# Patient Record
Sex: Male | Born: 1965 | Race: Black or African American | Hispanic: No | Marital: Single | Smoking: Current every day smoker
Health system: Southern US, Community
[De-identification: ages and names within clinical notes are randomized; demographics above are authoritative.]

## PROBLEM LIST (undated history)

## (undated) DIAGNOSIS — W3400XA Accidental discharge from unspecified firearms or gun, initial encounter: Secondary | ICD-10-CM

## (undated) DIAGNOSIS — E119 Type 2 diabetes mellitus without complications: Secondary | ICD-10-CM

## (undated) DIAGNOSIS — T148XXA Other injury of unspecified body region, initial encounter: Secondary | ICD-10-CM

## (undated) HISTORY — PX: ABDOMINAL SURGERY: SHX537

## (undated) HISTORY — PX: SPLENECTOMY: SUR1306

## (undated) HISTORY — PX: COLOSTOMY: SHX63

## (undated) HISTORY — PX: COLOSTOMY REVISION: SHX5232

---

## 2011-10-29 ENCOUNTER — Encounter (HOSPITAL_COMMUNITY): Payer: Self-pay | Admitting: Emergency Medicine

## 2011-10-29 ENCOUNTER — Emergency Department (INDEPENDENT_AMBULATORY_CARE_PROVIDER_SITE_OTHER)
Admission: EM | Admit: 2011-10-29 | Discharge: 2011-10-29 | Disposition: A | Discharge status: Home / Self Care | Payer: Medicaid - Out of State | Source: Home / Self Care | Attending: Family Medicine | Admitting: Family Medicine

## 2011-10-29 DIAGNOSIS — N5082 Scrotal pain: Secondary | ICD-10-CM

## 2011-10-29 DIAGNOSIS — N509 Disorder of male genital organs, unspecified: Secondary | ICD-10-CM

## 2011-10-29 HISTORY — DX: Accidental discharge from unspecified firearms or gun, initial encounter: W34.00XA

## 2011-10-29 HISTORY — DX: Other injury of unspecified body region, initial encounter: T14.8XXA

## 2011-10-29 LAB — POCT URINALYSIS DIP (DEVICE)
Leukocytes, UA: NEGATIVE
Nitrite: NEGATIVE
Protein, ur: 30 mg/dL — AB
Urobilinogen, UA: 0.2 mg/dL (ref 0.0–1.0)

## 2011-10-29 MED ORDER — AZITHROMYCIN 500 MG PO TABS: 1000.0000 mg | ORAL_TABLET | Freq: Once | ORAL | Status: AC

## 2011-10-29 MED ORDER — IBUPROFEN 600 MG PO TABS: 600.0000 mg | ORAL_TABLET | Freq: Three times a day (TID) | ORAL | Status: AC

## 2011-10-29 NOTE — Discharge Instructions (Signed)
Your urine test does not show signs of infection. I decided to treat you empirically (without test results) for possible Chlamydia infection given asymptomatic discharge from your penis. We will contact you about your test results and will call in a medication or ask you to return for an injection if needed. I am not sure what is causing your discomfort. As there are not obvious hernias, focal tenderness or testicular mass on my exam. Also your prostate felt normal on examination. It could be just a pulled muscle or ligament. Take the prescribed medications as instructed. Followup with the urologist number provided above if persistent or worsening symptoms.
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
## 2011-10-29 NOTE — ED Notes (Signed)
Pulling pain from low abdomen to scrotum per patient.  Onset approx 2 weeks ago.  Denies pain with urination.  Several days over the past 2 weeks has had tingling sensation, feels like he needs to urinate, but only a small amount produced.  Denies penile discharge

## 2011-10-31 LAB — GC/CHLAMYDIA PROBE AMP, GENITAL
Chlamydia, DNA Probe: NEGATIVE
GC Probe Amp, Genital: NEGATIVE

## 2018-10-28 ENCOUNTER — Emergency Department (HOSPITAL_COMMUNITY)
Admission: EM | Admit: 2018-10-28 | Discharge: 2018-10-28 | Disposition: A | Discharge status: Home / Self Care | Payer: Self-pay | Attending: Emergency Medicine | Admitting: Emergency Medicine

## 2018-10-28 ENCOUNTER — Other Ambulatory Visit: Payer: Self-pay

## 2018-10-28 ENCOUNTER — Emergency Department (HOSPITAL_COMMUNITY): Payer: Self-pay

## 2018-10-28 ENCOUNTER — Encounter (HOSPITAL_COMMUNITY): Payer: Self-pay | Admitting: Emergency Medicine

## 2018-10-28 DIAGNOSIS — S60221A Contusion of right hand, initial encounter: Secondary | ICD-10-CM | POA: Insufficient documentation

## 2018-10-28 DIAGNOSIS — R1031 Right lower quadrant pain: Secondary | ICD-10-CM | POA: Insufficient documentation

## 2018-10-28 DIAGNOSIS — Y9289 Other specified places as the place of occurrence of the external cause: Secondary | ICD-10-CM | POA: Insufficient documentation

## 2018-10-28 DIAGNOSIS — F17200 Nicotine dependence, unspecified, uncomplicated: Secondary | ICD-10-CM | POA: Insufficient documentation

## 2018-10-28 DIAGNOSIS — W010XXA Fall on same level from slipping, tripping and stumbling without subsequent striking against object, initial encounter: Secondary | ICD-10-CM | POA: Insufficient documentation

## 2018-10-28 DIAGNOSIS — Y9389 Activity, other specified: Secondary | ICD-10-CM | POA: Insufficient documentation

## 2018-10-28 DIAGNOSIS — Y990 Civilian activity done for income or pay: Secondary | ICD-10-CM | POA: Insufficient documentation

## 2018-10-28 MED ORDER — MELOXICAM 7.5 MG PO TABS: 15.0000 mg | ORAL_TABLET | Freq: Every day | ORAL | 0 refills | Status: DC

## 2018-10-28 NOTE — Discharge Instructions (Addendum)
X-ray today was negative. You can stop the naprosyn and try taking the meloxicam for added relief. Can also use ice when needed. Follow-up with your primary care doctor. Return here for any new/acute changes.

## 2018-10-28 NOTE — ED Notes (Signed)
Bed: WA03 Expected date:  Expected time:  Means of arrival:  Comments: 

## 2018-10-28 NOTE — ED Triage Notes (Signed)
Patient slipped on some oil at work. Patient tried to break his fall with his right hand. Right hand has a little swelling on his hand. Patient states he did a split and has some pain in right groin.
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
## 2018-10-28 NOTE — ED Notes (Signed)
Bed: WTR8 Expected date:  Expected time:  Means of arrival:  Comments: 

## 2019-03-05 ENCOUNTER — Ambulatory Visit (HOSPITAL_BASED_OUTPATIENT_CLINIC_OR_DEPARTMENT_OTHER)
Admission: RE | Admit: 2019-03-05 | Discharge: 2019-03-05 | Disposition: A | Discharge status: Home / Self Care | Payer: Self-pay | Source: Ambulatory Visit | Attending: Physician Assistant | Admitting: Physician Assistant

## 2019-03-05 ENCOUNTER — Emergency Department (HOSPITAL_COMMUNITY)
Admission: EM | Admit: 2019-03-05 | Discharge: 2019-03-05 | Disposition: A | Discharge status: Home / Self Care | Payer: Medicaid Other | Attending: Emergency Medicine | Admitting: Emergency Medicine

## 2019-03-05 ENCOUNTER — Emergency Department (HOSPITAL_COMMUNITY): Payer: Medicaid Other

## 2019-03-05 ENCOUNTER — Encounter (HOSPITAL_COMMUNITY): Payer: Self-pay | Admitting: *Deleted

## 2019-03-05 ENCOUNTER — Other Ambulatory Visit: Payer: Self-pay

## 2019-03-05 DIAGNOSIS — R2241 Localized swelling, mass and lump, right lower limb: Secondary | ICD-10-CM | POA: Insufficient documentation

## 2019-03-05 DIAGNOSIS — M7989 Other specified soft tissue disorders: Secondary | ICD-10-CM

## 2019-03-05 DIAGNOSIS — F17210 Nicotine dependence, cigarettes, uncomplicated: Secondary | ICD-10-CM | POA: Insufficient documentation

## 2019-03-05 LAB — BASIC METABOLIC PANEL
Anion gap: 9 (ref 5–15)
BUN: 13 mg/dL (ref 6–20)
CO2: 24 mmol/L (ref 22–32)
Calcium: 8.9 mg/dL (ref 8.9–10.3)
Chloride: 104 mmol/L (ref 98–111)
Creatinine, Ser: 0.98 mg/dL (ref 0.61–1.24)
GFR calc Af Amer: >60 mL/min (ref >60)
GFR calc non Af Amer: >60 mL/min (ref >60)
Glucose, Bld: 130 mg/dL — ABNORMAL HIGH (ref 70–99)
Potassium: 3.6 mmol/L (ref 3.5–5.1)
Sodium: 137 mmol/L (ref 135–145)

## 2019-03-05 LAB — CBC WITH DIFFERENTIAL/PLATELET
Abs Immature Granulocytes: 0.01 10*3/uL (ref 0.00–0.07)
Basophils Absolute: 0.1 10*3/uL (ref 0.0–0.1)
Basophils Relative: 1 %
Eosinophils Absolute: 0.3 10*3/uL (ref 0.0–0.5)
Eosinophils Relative: 3 %
HCT: 40.6 % (ref 39.0–52.0)
Hemoglobin: 13.3 g/dL (ref 13.0–17.0)
Immature Granulocytes: 0 %
Lymphocytes Relative: 26 %
Lymphs Abs: 2.1 10*3/uL (ref 0.7–4.0)
MCH: 30.3 pg (ref 26.0–34.0)
MCHC: 32.8 g/dL (ref 30.0–36.0)
MCV: 92.5 fL (ref 80.0–100.0)
Monocytes Absolute: 1 10*3/uL (ref 0.1–1.0)
Monocytes Relative: 13 %
Neutro Abs: 4.8 10*3/uL (ref 1.7–7.7)
Neutrophils Relative %: 57 %
Platelets: 416 10*3/uL — ABNORMAL HIGH (ref 150–400)
RBC: 4.39 MIL/uL (ref 4.22–5.81)
RDW: 14 % (ref 11.5–15.5)
WBC: 8.3 10*3/uL (ref 4.0–10.5)
nRBC: 0 % (ref 0.0–0.2)

## 2019-03-05 MED ORDER — ENOXAPARIN SODIUM 150 MG/ML ~~LOC~~ SOLN
1.0000 mg/kg | Freq: Once | SUBCUTANEOUS | Status: AC
  Administered 2019-03-05: 125 mg via SUBCUTANEOUS
  Filled 2019-03-05: qty 0.83

## 2019-03-05 NOTE — Progress Notes (Signed)
Right leg venous duplex exam was completed.  Preliminary results can be found under CV Proc in Chart Review.   Cira Servant, RVT, RDCS
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
## 2019-03-05 NOTE — ED Notes (Signed)
Pt verbalized understanding od d/c instructions and followup care. Pt had no further questions at this time

## 2019-03-05 NOTE — ED Triage Notes (Signed)
Pt c/o R knee and L index finger x 1 week. Denies injury, reports waking up with pain.

## 2019-03-05 NOTE — Discharge Instructions (Addendum)
Your laboratory results are within normal limits.  The x-ray of your right knee showed a small effusion.  I am concerned about a clot to your right leg, and ultrasound has been ordered, this will be performed tomorrow morning, please see the order attached to your discharge papers.  You received Lovenox while in the emergency department today, please refrain from taking any NSAIDs.

## 2019-03-16 ENCOUNTER — Other Ambulatory Visit: Payer: Self-pay

## 2019-03-16 ENCOUNTER — Emergency Department (HOSPITAL_COMMUNITY)
Admission: EM | Admit: 2019-03-16 | Discharge: 2019-03-16 | Disposition: A | Discharge status: Home / Self Care | Payer: Medicaid Other | Attending: Emergency Medicine | Admitting: Emergency Medicine

## 2019-03-16 ENCOUNTER — Emergency Department (HOSPITAL_COMMUNITY): Payer: Medicaid Other

## 2019-03-16 DIAGNOSIS — S82121A Displaced fracture of lateral condyle of right tibia, initial encounter for closed fracture: Secondary | ICD-10-CM

## 2019-03-16 DIAGNOSIS — X509XXA Other and unspecified overexertion or strenuous movements or postures, initial encounter: Secondary | ICD-10-CM | POA: Insufficient documentation

## 2019-03-16 DIAGNOSIS — Y92812 Truck as the place of occurrence of the external cause: Secondary | ICD-10-CM | POA: Insufficient documentation

## 2019-03-16 DIAGNOSIS — R61 Generalized hyperhidrosis: Secondary | ICD-10-CM | POA: Insufficient documentation

## 2019-03-16 DIAGNOSIS — F17200 Nicotine dependence, unspecified, uncomplicated: Secondary | ICD-10-CM | POA: Insufficient documentation

## 2019-03-16 DIAGNOSIS — Z79899 Other long term (current) drug therapy: Secondary | ICD-10-CM | POA: Insufficient documentation

## 2019-03-16 DIAGNOSIS — R509 Fever, unspecified: Secondary | ICD-10-CM | POA: Insufficient documentation

## 2019-03-16 DIAGNOSIS — Y9389 Activity, other specified: Secondary | ICD-10-CM | POA: Insufficient documentation

## 2019-03-16 DIAGNOSIS — Y999 Unspecified external cause status: Secondary | ICD-10-CM | POA: Insufficient documentation

## 2019-03-16 LAB — BASIC METABOLIC PANEL
Anion gap: 12 (ref 5–15)
BUN: 8 mg/dL (ref 6–20)
CO2: 27 mmol/L (ref 22–32)
Calcium: 9.2 mg/dL (ref 8.9–10.3)
Chloride: 95 mmol/L — ABNORMAL LOW (ref 98–111)
Creatinine, Ser: 1.05 mg/dL (ref 0.61–1.24)
GFR calc Af Amer: >60 mL/min (ref >60)
GFR calc non Af Amer: >60 mL/min (ref >60)
Glucose, Bld: 154 mg/dL — ABNORMAL HIGH (ref 70–99)
Potassium: 4.1 mmol/L (ref 3.5–5.1)
Sodium: 134 mmol/L — ABNORMAL LOW (ref 135–145)

## 2019-03-16 LAB — CBC
HCT: 41.4 % (ref 39.0–52.0)
Hemoglobin: 13.7 g/dL (ref 13.0–17.0)
MCH: 29.9 pg (ref 26.0–34.0)
MCHC: 33.1 g/dL (ref 30.0–36.0)
MCV: 90.4 fL (ref 80.0–100.0)
Platelets: 614 10*3/uL — ABNORMAL HIGH (ref 150–400)
RBC: 4.58 MIL/uL (ref 4.22–5.81)
RDW: 13.2 % (ref 11.5–15.5)
WBC: 10.3 10*3/uL (ref 4.0–10.5)
nRBC: 0 % (ref 0.0–0.2)

## 2019-03-16 MED ORDER — OXYCODONE-ACETAMINOPHEN 5-325 MG PO TABS
1.0000 | ORAL_TABLET | Freq: Once | ORAL | Status: AC
  Administered 2019-03-16: 1 via ORAL
  Filled 2019-03-16: qty 1

## 2019-03-16 MED ORDER — OXYCODONE-ACETAMINOPHEN 5-325 MG PO TABS: 2.0000 | ORAL_TABLET | ORAL | 0 refills | Status: DC | PRN
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
## 2019-03-16 NOTE — ED Notes (Signed)
Patient transported to CT 

## 2019-03-16 NOTE — ED Notes (Signed)
Pt verbalized understanding of d/c instructions, prescriptions and follow up care.NO additional questions at his time.

## 2019-03-16 NOTE — Discharge Instructions (Addendum)
Apply ice as needed.  Keep the knee elevated as much as possible.  No weight bearing on your right leg until cleared by the orthopedic doctor.  Wear the knee brace as needed.

## 2019-03-16 NOTE — ED Notes (Signed)
Ortho tech contacted regarding knee immobilizer

## 2019-03-16 NOTE — ED Triage Notes (Signed)
Pt c/o right knee/leg pain and swelling. Seen here 03/05/2019 for same. States his leg is no better.

## 2020-02-21 ENCOUNTER — Other Ambulatory Visit: Payer: Self-pay

## 2020-02-21 ENCOUNTER — Observation Stay (HOSPITAL_COMMUNITY)
Admission: EM | Admit: 2020-02-21 | Discharge: 2020-02-22 | Disposition: A | Discharge status: Home / Self Care | Payer: Medicaid Other | Attending: Internal Medicine | Admitting: Internal Medicine

## 2020-02-21 ENCOUNTER — Emergency Department (HOSPITAL_COMMUNITY): Payer: Medicaid Other

## 2020-02-21 DIAGNOSIS — Z20822 Contact with and (suspected) exposure to covid-19: Secondary | ICD-10-CM | POA: Insufficient documentation

## 2020-02-21 DIAGNOSIS — I10 Essential (primary) hypertension: Secondary | ICD-10-CM | POA: Insufficient documentation

## 2020-02-21 DIAGNOSIS — E119 Type 2 diabetes mellitus without complications: Secondary | ICD-10-CM | POA: Insufficient documentation

## 2020-02-21 DIAGNOSIS — F17200 Nicotine dependence, unspecified, uncomplicated: Secondary | ICD-10-CM | POA: Insufficient documentation

## 2020-02-21 DIAGNOSIS — R739 Hyperglycemia, unspecified: Secondary | ICD-10-CM

## 2020-02-21 DIAGNOSIS — J36 Peritonsillar abscess: Principal | ICD-10-CM | POA: Diagnosis present

## 2020-02-21 HISTORY — DX: Type 2 diabetes mellitus without complications: E11.9

## 2020-02-21 LAB — CBC WITH DIFFERENTIAL/PLATELET
Abs Immature Granulocytes: 0.1 10*3/uL — ABNORMAL HIGH (ref 0.00–0.07)
Basophils Absolute: 0.1 10*3/uL (ref 0.0–0.1)
Basophils Relative: 0 %
Eosinophils Absolute: 0.1 10*3/uL (ref 0.0–0.5)
Eosinophils Relative: 0 %
HCT: 43.2 % (ref 39.0–52.0)
Hemoglobin: 14.5 g/dL (ref 13.0–17.0)
Immature Granulocytes: 1 %
Lymphocytes Relative: 14 %
Lymphs Abs: 2.7 10*3/uL (ref 0.7–4.0)
MCH: 30.1 pg (ref 26.0–34.0)
MCHC: 33.6 g/dL (ref 30.0–36.0)
MCV: 89.6 fL (ref 80.0–100.0)
Monocytes Absolute: 2.2 10*3/uL — ABNORMAL HIGH (ref 0.1–1.0)
Monocytes Relative: 11 %
Neutro Abs: 14.8 10*3/uL — ABNORMAL HIGH (ref 1.7–7.7)
Neutrophils Relative %: 74 %
Platelets: 339 10*3/uL (ref 150–400)
RBC: 4.82 MIL/uL (ref 4.22–5.81)
RDW: 14 % (ref 11.5–15.5)
WBC: 19.9 10*3/uL — ABNORMAL HIGH (ref 4.0–10.5)
nRBC: 0 % (ref 0.0–0.2)

## 2020-02-21 LAB — HIV ANTIBODY (ROUTINE TESTING W REFLEX): HIV Screen 4th Generation wRfx: NONREACTIVE

## 2020-02-21 LAB — RESPIRATORY PANEL BY RT PCR (FLU A&B, COVID)
Influenza A by PCR: NEGATIVE
Influenza B by PCR: NEGATIVE
SARS Coronavirus 2 by RT PCR: NEGATIVE

## 2020-02-21 LAB — BASIC METABOLIC PANEL
Anion gap: 11 (ref 5–15)
BUN: 15 mg/dL (ref 6–20)
CO2: 23 mmol/L (ref 22–32)
Calcium: 9 mg/dL (ref 8.9–10.3)
Chloride: 101 mmol/L (ref 98–111)
Creatinine, Ser: 1.01 mg/dL (ref 0.61–1.24)
GFR, Estimated: >60 mL/min (ref >60)
Glucose, Bld: 283 mg/dL — ABNORMAL HIGH (ref 70–99)
Potassium: 3.7 mmol/L (ref 3.5–5.1)
Sodium: 135 mmol/L (ref 135–145)

## 2020-02-21 LAB — I-STAT CHEM 8, ED
BUN: 16 mg/dL (ref 6–20)
Calcium, Ion: 1.15 mmol/L (ref 1.15–1.40)
Chloride: 100 mmol/L (ref 98–111)
Creatinine, Ser: 0.8 mg/dL (ref 0.61–1.24)
Glucose, Bld: 297 mg/dL — ABNORMAL HIGH (ref 70–99)
HCT: 45 % (ref 39.0–52.0)
Hemoglobin: 15.3 g/dL (ref 13.0–17.0)
Potassium: 3.7 mmol/L (ref 3.5–5.1)
Sodium: 138 mmol/L (ref 135–145)
TCO2: 27 mmol/L (ref 22–32)

## 2020-02-21 LAB — CBG MONITORING, ED: Glucose-Capillary: 375 mg/dL — ABNORMAL HIGH (ref 70–99)

## 2020-02-21 LAB — GLUCOSE, CAPILLARY
Glucose-Capillary: 385 mg/dL — ABNORMAL HIGH (ref 70–99)
Glucose-Capillary: 216 mg/dL — ABNORMAL HIGH (ref 70–99)

## 2020-02-21 LAB — GROUP A STREP BY PCR: Group A Strep by PCR: NOT DETECTED

## 2020-02-21 MED ORDER — ACETAMINOPHEN 650 MG RE SUPP: 650.0000 mg | Freq: Four times a day (QID) | RECTAL | Status: DC | PRN

## 2020-02-21 MED ORDER — ACETAMINOPHEN 325 MG PO TABS: 650.0000 mg | ORAL_TABLET | Freq: Four times a day (QID) | ORAL | Status: DC | PRN

## 2020-02-21 MED ORDER — CLINDAMYCIN PHOSPHATE 900 MG/50ML IV SOLN
900.0000 mg | Freq: Once | INTRAVENOUS | Status: AC
  Administered 2020-02-21: 900 mg via INTRAVENOUS
  Filled 2020-02-21: qty 50

## 2020-02-21 MED ORDER — LIVING WELL WITH DIABETES BOOK
Freq: Once | Status: AC
  Filled 2020-02-21: qty 1

## 2020-02-21 MED ORDER — LIDOCAINE-EPINEPHRINE 1 %-1:100000 IJ SOLN
10.0000 mL | Freq: Once | INTRAMUSCULAR | Status: AC
  Administered 2020-02-21: 10 mL
  Filled 2020-02-21: qty 1

## 2020-02-21 MED ORDER — INSULIN ASPART 100 UNIT/ML ~~LOC~~ SOLN
0.0000 [IU] | Freq: Three times a day (TID) | SUBCUTANEOUS | Status: DC
  Administered 2020-02-21 (×2): 5 [IU] via SUBCUTANEOUS
  Administered 2020-02-22: 2 [IU] via SUBCUTANEOUS

## 2020-02-21 MED ORDER — LACTATED RINGERS IV BOLUS
1000.0000 mL | Freq: Once | INTRAVENOUS | Status: AC
  Administered 2020-02-21: 1000 mL via INTRAVENOUS

## 2020-02-21 MED ORDER — SODIUM CHLORIDE 0.9 % IV SOLN: Freq: Once | INTRAVENOUS | Status: AC

## 2020-02-21 MED ORDER — DEXAMETHASONE SODIUM PHOSPHATE 10 MG/ML IJ SOLN
10.0000 mg | Freq: Once | INTRAMUSCULAR | Status: AC
  Administered 2020-02-21: 10 mg via INTRAVENOUS
  Filled 2020-02-21: qty 1

## 2020-02-21 MED ORDER — IOHEXOL 300 MG/ML  SOLN
75.0000 mL | Freq: Once | INTRAMUSCULAR | Status: AC | PRN
  Administered 2020-02-21: 75 mL via INTRAVENOUS

## 2020-02-21 MED ORDER — ONDANSETRON HCL 4 MG/2ML IJ SOLN: 4.0000 mg | Freq: Four times a day (QID) | INTRAMUSCULAR | Status: DC | PRN

## 2020-02-21 MED ORDER — MORPHINE SULFATE (PF) 2 MG/ML IV SOLN: 2.0000 mg | INTRAVENOUS | Status: DC | PRN

## 2020-02-21 MED ORDER — HYDRALAZINE HCL 20 MG/ML IJ SOLN: 10.0000 mg | Freq: Three times a day (TID) | INTRAMUSCULAR | Status: DC | PRN

## 2020-02-21 MED ORDER — ONDANSETRON HCL 4 MG PO TABS: 4.0000 mg | ORAL_TABLET | Freq: Four times a day (QID) | ORAL | Status: DC | PRN

## 2020-02-21 MED ORDER — CLINDAMYCIN PHOSPHATE 900 MG/50ML IV SOLN
900.0000 mg | Freq: Three times a day (TID) | INTRAVENOUS | Status: DC
  Administered 2020-02-21 – 2020-02-22 (×2): 900 mg via INTRAVENOUS
  Filled 2020-02-21 (×4): qty 50

## 2020-02-21 MED ORDER — SODIUM CHLORIDE 0.9 % IV SOLN
3.0000 g | Freq: Once | INTRAVENOUS | Status: AC
  Administered 2020-02-21: 3 g via INTRAVENOUS
  Filled 2020-02-21: qty 3

## 2020-02-21 NOTE — Progress Notes (Addendum)
Inpatient Diabetes Program Recommendations  AACE/ADA: New Consensus Statement on Inpatient Glycemic Control (2015)  Target Ranges:  Prepandial:   less than 140 mg/dL      Peak postprandial:   less than 180 mg/dL (1-2 hours)      Critically ill patients:  140 - 180 mg/dL   Lab Results  Component Value Date   GLUCAP 375 (H) 02/21/2020    Review of Glycemic Control  Diabetes history: boarderline DM hx Outpatient Diabetes medications: none Current orders for Inpatient glycemic control: Novolog 0-6 tid  Inpatient Diabetes Program Recommendations:    Note pt received Decadron 10 mg prior to abscess drainage.  -  Increase Novolog to "moderate" 0-15 units tid + hs   Pt with no insurance will need PCP follow up. Suggest starting at least a sulfonylurea at time of d/c. And close follow up. Living well with DM booklet ordered.  Addendum 2:40 pm:  Spoke with pt at bedside for hyperglycemia. Pt's wife has DM. 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 Pt does not have a PCP yet but does have an appointment with the [facility] on November 15th. Encouraged pt to keep appointment. Pt reports having medicaid insurance.  Discussed in detail diet and exercise. Discussed hypoglycemia and A1c and glucose goals. Pt has a plan with wife for diet modifications and had many questions related to specific food choices.   Thanks,  Tama Headings RN, MSN, BC-ADM Inpatient Diabetes Coordinator Team Pager [phone] (8a-5p)

## 2020-02-21 NOTE — ED Notes (Signed)
Lunch Tray Ordered @ 1020.  

## 2020-02-21 NOTE — ED Notes (Signed)
Pt noted to be 88% on room air while asleep, pt reports having sleep apnea. Placed on O2 at 2lpm via [place].

## 2020-02-21 NOTE — ED Notes (Signed)
Patient transported to CT 
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
## 2020-02-21 NOTE — ED Triage Notes (Signed)
Pt has been fight sinus drainage x 3 days and then today noticed severe swelling in his throat and unaable to swallow secretions and barely able to talk. Major swelling to left side. Unable to visualize airway.

## 2020-02-21 NOTE — ED Notes (Signed)
Pt to room at this time, Dr. Betsey Holiday at bedside.
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
## 2020-02-21 NOTE — ED Notes (Addendum)
Pt reports feeling much better after I &D procedure. Pt states "I can breathe better". Will continue to monitor.

## 2020-02-21 NOTE — ED Notes (Signed)
Pharmacy contacted regarding Unasyn; medication being sent ASAP

## 2020-02-21 NOTE — ED Notes (Signed)
Suction set up prepared at bedside.

## 2020-02-22 ENCOUNTER — Encounter (HOSPITAL_COMMUNITY): Payer: Self-pay | Admitting: Internal Medicine

## 2020-02-22 DIAGNOSIS — J36 Peritonsillar abscess: Secondary | ICD-10-CM

## 2020-02-22 DIAGNOSIS — E1169 Type 2 diabetes mellitus with other specified complication: Secondary | ICD-10-CM

## 2020-02-22 LAB — HEMOGLOBIN A1C
Hgb A1c MFr Bld: 8.8 % — ABNORMAL HIGH (ref 4.8–5.6)
Mean Plasma Glucose: 206 mg/dL

## 2020-02-22 LAB — COMPREHENSIVE METABOLIC PANEL
ALT: 21 U/L (ref 0–44)
AST: 16 U/L (ref 15–41)
Albumin: 3.1 g/dL — ABNORMAL LOW (ref 3.5–5.0)
Alkaline Phosphatase: 81 U/L (ref 38–126)
Anion gap: 9 (ref 5–15)
BUN: 12 mg/dL (ref 6–20)
CO2: 27 mmol/L (ref 22–32)
Calcium: 9.2 mg/dL (ref 8.9–10.3)
Chloride: 99 mmol/L (ref 98–111)
Creatinine, Ser: 0.89 mg/dL (ref 0.61–1.24)
GFR, Estimated: >60 mL/min (ref >60)
Glucose, Bld: 227 mg/dL — ABNORMAL HIGH (ref 70–99)
Potassium: 3.9 mmol/L (ref 3.5–5.1)
Sodium: 135 mmol/L (ref 135–145)
Total Bilirubin: 0.8 mg/dL (ref 0.3–1.2)
Total Protein: 7.4 g/dL (ref 6.5–8.1)

## 2020-02-22 LAB — GLUCOSE, CAPILLARY
Glucose-Capillary: 226 mg/dL — ABNORMAL HIGH (ref 70–99)
Glucose-Capillary: 214 mg/dL — ABNORMAL HIGH (ref 70–99)
Glucose-Capillary: 185 mg/dL — ABNORMAL HIGH (ref 70–99)

## 2020-02-22 LAB — CBC
HCT: 40.5 % (ref 39.0–52.0)
Hemoglobin: 14 g/dL (ref 13.0–17.0)
MCH: 30.6 pg (ref 26.0–34.0)
MCHC: 34.6 g/dL (ref 30.0–36.0)
MCV: 88.4 fL (ref 80.0–100.0)
Platelets: 350 10*3/uL (ref 150–400)
RBC: 4.58 MIL/uL (ref 4.22–5.81)
RDW: 13.7 % (ref 11.5–15.5)
WBC: 18.4 10*3/uL — ABNORMAL HIGH (ref 4.0–10.5)
nRBC: 0 % (ref 0.0–0.2)

## 2020-02-22 MED ORDER — RISAQUAD PO CAPS
2.0000 | ORAL_CAPSULE | Freq: Every day | ORAL | 0 refills | Status: DC
Start: 2020-02-23 — End: 2021-09-09

## 2020-02-22 MED ORDER — INSULIN ASPART 100 UNIT/ML ~~LOC~~ SOLN: 0.0000 [IU] | Freq: Every day | SUBCUTANEOUS | Status: DC

## 2020-02-22 MED ORDER — INSULIN ASPART 100 UNIT/ML ~~LOC~~ SOLN
0.0000 [IU] | Freq: Three times a day (TID) | SUBCUTANEOUS | Status: DC
  Administered 2020-02-22: 3 [IU] via SUBCUTANEOUS

## 2020-02-22 MED ORDER — BLOOD GLUCOSE MONITOR KIT: PACK | 0 refills | Status: AC

## 2020-02-22 MED ORDER — GLIPIZIDE 5 MG PO TABS
5.0000 mg | ORAL_TABLET | Freq: Two times a day (BID) | ORAL | Status: DC
  Administered 2020-02-22: 5 mg via ORAL
  Filled 2020-02-22 (×3): qty 1

## 2020-02-22 MED ORDER — CLINDAMYCIN HCL 300 MG PO CAPS
300.0000 mg | ORAL_CAPSULE | Freq: Four times a day (QID) | ORAL | 0 refills | Status: DC
Start: 2020-02-22 — End: 2020-05-19

## 2020-02-22 MED ORDER — GLIPIZIDE 5 MG PO TABS
5.0000 mg | ORAL_TABLET | Freq: Two times a day (BID) | ORAL | 0 refills | Status: DC
Start: 2020-02-22 — End: 2020-05-19

## 2020-02-22 MED ORDER — CLINDAMYCIN HCL 300 MG PO CAPS
300.0000 mg | ORAL_CAPSULE | Freq: Four times a day (QID) | ORAL | Status: DC
  Administered 2020-02-22: 300 mg via ORAL
  Filled 2020-02-22 (×2): qty 1

## 2020-02-22 MED ORDER — RISAQUAD PO CAPS
2.0000 | ORAL_CAPSULE | Freq: Every day | ORAL | Status: DC
  Administered 2020-02-22: 2 via ORAL
  Filled 2020-02-22: qty 2

## 2020-02-22 NOTE — Discharge Summary (Signed)
Pt IV removed, catheter intact Pt discharge education provided at bedside to patient and pt significant other. Thorough education on diabetes management provided, pt verbalizes understanding.  Pt has all belongings including printed prescription.  Pt awaiting discharge ride.

## 2020-02-22 NOTE — Discharge Summary (Signed)
Physician Discharge Summary  Kyle Newman QJF:[id number] DOB: 07-30-1965 DOA: 02/21/2020  PCP: Pcp, No  Admit date: 02/21/2020 Discharge date: 02/22/2020  Admitted From: home Discharge disposition: home   Recommendations for Outpatient Follow-Up:   1. Needs titration of diabetic meds 2. ENT follow up PRN   Discharge Diagnosis:   Active Problems:   Tonsillar abscess    Discharge Condition: Improved.  Diet recommendation: Low sodium, heart healthy.  Carbohydrate-modified.  Wound care: None.  Code status: Full.   History of Present Illness:   Kyle Newman is a 54 y.o. male with no significant medical history. Presents with throat swelling of 4 days duration. He initially noted some soreness in this throat. This progressed to full on throat pain with swallowing and drainage into the back of his throat.Marland Kitchen He tried to use naproxsyn and ice to help. He had intermittent fever. Felt  like he was choking with the drainage. Painful to swallow and spit. Had a similar episode about 1 year ago. He denies any other aggravating or alleviating factors.     Hospital Course by Problem:   Left peritonsillar abscess     -  s/p I&D     - was seen by ENT: abx rec is for clinamycin 364m PO QID for 10 days at discharge.     -eating well, no pain  New DM type 2     - he reports a history of "borderline diabetes"     - start glipizide     -meter given     -needs   Hx of HTN     - outpatient follow up     Medical Consultants:   ENT   Discharge Exam:   Vitals:   02/22/20 0510 02/22/20 0917  BP: 140/86 (!) 142/89  Pulse: 71 86  Resp: 18   Temp: 98.5 F (36.9 C)   SpO2: 96% 94%   Vitals:   02/21/20 1739 02/22/20 0031 02/22/20 0510 02/22/20 0917  BP: (!) 157/82 (!) 142/83 140/86 (!) 142/89  Pulse: 78 77 71 86  Resp: 20 16 18    Temp: (!) 97.3 F (36.3 C) 98.6 F (37 C) 98.5 F (36.9 C)   TempSrc: Oral Oral Oral   SpO2: 90% 95% 96% 94%  Weight:        Height:        General exam: Appears calm and comfortable. Up walking the halls  The results of significant diagnostics from this hospitalization (including imaging, microbiology, ancillary and laboratory) are listed below for reference.     Procedures and Diagnostic Studies:   CT Soft Tissue Neck W Contrast  Result Date: 02/21/2020 CLINICAL DATA:  Tonsils/adenoid disorder.  Severe sore throat EXAM: CT NECK WITH CONTRAST TECHNIQUE: Multidetector CT imaging of the neck was performed using the standard protocol following the bolus administration of intravenous contrast. CONTRAST:  725mOMNIPAQUE IOHEXOL 300 MG/ML  SOLN COMPARISON:  None. FINDINGS: Pharynx and larynx: Lobulated low-density collection extending from the left peritonsillar fossa superiorly, 4.5 cm in craniocaudal extent and 3.5 cm in diameter. There is adjacent submucosal edema of the pharynx and supraglottic larynx, eccentric to the left. The oro pharyngeal walls are apposed due to the collection and swelling, with the uvula deviated to the right. No floor of mouth swelling detected. No retropharyngeal abscess Salivary glands: No inflammation, mass, or stone. Thyroid: Normal. Lymph nodes: Expected reactive adenitis appearance. Vascular: No venous thrombosis or other acute finding. Limited intracranial: Negative Visualized orbits: Negative Mastoids  and visualized paranasal sinuses: Clear where covered. Skeleton: No visible spinal extension Upper chest: Clear.  No apical pneumonia or mediastinitis. IMPRESSION: 1. Left peritonsillar abscess measuring up to 3.5 x 4.5 cm. Contiguous submucosal edema extends from the pharynx to left supraglottic larynx. 2. Cervical adenitis. Electronically Signed   By: Monte Fantasia M.D.   On: 02/21/2020 05:12     Labs:   Basic Metabolic Panel: Recent Labs  Lab 02/21/20 0348 02/21/20 0348 02/21/20 0401 02/22/20 0114  NA 135  --  138 135  K 3.7   < > 3.7 3.9  CL 101  --  100 99  CO2 23  --   --   27  GLUCOSE 283*  --  297* 227*  BUN 15  --  16 12  CREATININE 1.01  --  0.80 0.89  CALCIUM 9.0  --   --  9.2   < > = values in this interval not displayed.   GFR Estimated Creatinine Clearance: 136.4 mL/min (by C-G formula based on SCr of 0.89 mg/dL). Liver Function Tests: Recent Labs  Lab 02/22/20 0114  AST 16  ALT 21  ALKPHOS 81  BILITOT 0.8  PROT 7.4  ALBUMIN 3.1*   No results for input(s): LIPASE, AMYLASE in the last 168 hours. No results for input(s): AMMONIA in the last 168 hours. Coagulation profile No results for input(s): INR, PROTIME in the last 168 hours.  CBC: Recent Labs  Lab 02/21/20 0348 02/21/20 0401 02/22/20 0114  WBC 19.9*  --  18.4*  NEUTROABS 14.8*  --   --   HGB 14.5 15.3 14.0  HCT 43.2 45.0 40.5  MCV 89.6  --  88.4  PLT 339  --  350   Cardiac Enzymes: No results for input(s): CKTOTAL, CKMB, CKMBINDEX, TROPONINI in the last 168 hours. BNP: Invalid input(s): POCBNP CBG: Recent Labs  Lab 02/21/20 1146 02/21/20 1608 02/21/20 2140 02/22/20 0145 02/22/20 0603  GLUCAP 375* 385* 216* 226* 214*   D-Dimer No results for input(s): DDIMER in the last 72 hours. Hgb A1c Recent Labs    02/21/20 0358  HGBA1C 8.8*   Lipid Profile No results for input(s): CHOL, HDL, LDLCALC, TRIG, CHOLHDL, LDLDIRECT in the last 72 hours. Thyroid function studies No results for input(s): TSH, T4TOTAL, T3FREE, THYROIDAB in the last 72 hours.  Invalid input(s): FREET3 Anemia work up No results for input(s): VITAMINB12, FOLATE, FERRITIN, TIBC, IRON, RETICCTPCT in the last 72 hours. Microbiology Recent Results (from the past 240 hour(s))  Group A Strep by PCR     Status: None   Collection Time: 02/21/20  3:56 AM   Specimen: Throat; Sterile Swab  Result Value Ref Range Status   Group A Strep by PCR NOT DETECTED NOT DETECTED Final    Comment: Performed at [facility], [address] [place], [place] [address]  Respiratory Panel by RT PCR (Flu A&B,  Covid) - Nasopharyngeal Swab     Status: None   Collection Time: 02/21/20  4:56 AM   Specimen: Nasopharyngeal Swab  Result Value Ref Range Status   SARS Coronavirus 2 by RT PCR NEGATIVE NEGATIVE Final    Comment: (NOTE) SARS-CoV-2 target nucleic acids are NOT DETECTED.  The SARS-CoV-2 RNA is generally detectable in upper respiratoy specimens during the acute phase of infection. The lowest concentration of SARS-CoV-2 viral copies this assay can detect is 131 copies/mL. A negative result does not preclude SARS-Cov-2 infection and should not be used as the sole basis for  treatment or other patient management decisions. A negative result may occur with  improper specimen collection/handling, submission of specimen other than nasopharyngeal swab, presence of viral mutation(s) within the areas targeted by this assay, and inadequate number of viral copies (<131 copies/mL). A negative result must be combined with clinical observations, patient history, and epidemiological information. The expected result is Negative.  Fact Sheet for Patients:  PinkCheek.be  Fact Sheet for Healthcare Providers:  GravelBags.it  This test is no t yet approved or cleared by the [place] FDA and  has been authorized for detection and/or diagnosis of SARS-CoV-2 by FDA under an Emergency Use Authorization (EUA). This EUA will remain  in effect (meaning this test can be used) for the duration of the COVID-19 declaration under Section 564(b)(1) of the Act, 21 U.S.C. section 360bbb-3(b)(1), unless the authorization is terminated or revoked sooner.     Influenza A by PCR NEGATIVE NEGATIVE Final   Influenza B by PCR NEGATIVE NEGATIVE Final    Comment: (NOTE) The Xpert Xpress SARS-CoV-2/FLU/RSV assay is intended as an aid in  the diagnosis of influenza from Nasopharyngeal swab specimens and  should not be used as a sole basis for treatment. Nasal  washings and  aspirates are unacceptable for Xpert Xpress SARS-CoV-2/FLU/RSV  testing.  Fact Sheet for Patients: PinkCheek.be  Fact Sheet for Healthcare Providers: GravelBags.it  This test is not yet approved or cleared by the [place] FDA and  has been authorized for detection and/or diagnosis of SARS-CoV-2 by  FDA under an Emergency Use Authorization (EUA). This EUA will remain  in effect (meaning this test can be used) for the duration of the  Covid-19 declaration under Section 564(b)(1) of the Act, 21  U.S.C. section 360bbb-3(b)(1), unless the authorization is  terminated or revoked. Performed at [facility], [place] [address] [place], [place] [address]   Aerobic/Anaerobic Culture (surgical/deep wound)     Status: None (Preliminary result)   Collection Time: 02/21/20  6:06 AM   Specimen: Abscess  Result Value Ref Range Status   Specimen Description ABSCESS LEFT PERITONSILLAR  Final   Special Requests NONE  Final   Gram Stain   Final    ABUNDANT WBC PRESENT, PREDOMINANTLY PMN MODERATE GRAM POSITIVE COCCI IN CHAINS    Culture   Final    CULTURE REINCUBATED FOR BETTER GROWTH Performed at [facility], [place] [address] [place], [place] [address]    Report Status PENDING  Incomplete     Discharge Instructions:   Discharge Instructions    Diet Carb Modified   Complete by: As directed    Increase activity slowly   Complete by: As directed      Allergies as of 02/22/2020   No Known Allergies     Medication List    TAKE these medications   acidophilus Caps capsule Take 2 capsules by mouth daily. Start taking on: February 23, 2020   blood glucose meter kit and supplies Kit Dispense based on patient and insurance preference. Use up to four times daily as directed. (FOR ICD-9 250.00, 250.01).   clindamycin 300 MG capsule Commonly known as: CLEOCIN Take 1 capsule (300 mg total) by mouth every  6 (six) hours.   glipiZIDE 5 MG tablet Commonly known as: GLUCOTROL Take 1 tablet (5 mg total) by mouth 2 (two) times daily before a meal.   naproxen sodium 220 MG tablet Commonly known as: ALEVE Take 220-440 mg by mouth 2 (two) times daily as needed (pain).  Follow-up [place] [facility] Follow up on 03/16/2020.   Why: As previously scheduled Contact information: [address] [place] [address] [phone]               Time coordinating discharge: 25 min  Signed:  Geradine Girt DO  [facility] 02/22/2020, 11:00 AM

## 2020-02-22 NOTE — Discharge Instructions (Signed)
Hemoglobin A1c level looks at last 3 months what your average blood sugar is. Diabetes starts at 6.5% If you have diabetes the goal A1c to be at is 7% or less (an average of 150 all the time when checking your glucose by fingerstick) Be careful of hypoglycemia (low blood sugar) less than 70. When having a low blood sugar take in a quick sugar. Reference the hypoglycemia handout.  Keep glucose tablets in your truck in case of hypoglycemia. If you think you are having a low blood sugar less than 70 chew 4 tablets at a time. Recheck you sugar in 15 minutes. If still low, chew 4 more glucose tablets and recheck glucose again in 15 minutes. If still low continuously call 911.  Total amount of carbohydrates to eat in each meal up to 60 grams of carbs, 15-30 gram of carbohydrates per snack.

## 2020-02-22 NOTE — Progress Notes (Signed)
Pt denies wheelchair  Pt ambulated off unit with visitor

## 2020-02-22 NOTE — Care Management (Signed)
Spoke w patient at bedside.  Obtained copy of [place] Medicaid card, placed in chart to be sent to medical records. Card dates are active. Discussed Glucose meter and supplies. No other CM needs.

## 2020-02-26 LAB — AEROBIC/ANAEROBIC CULTURE W GRAM STAIN (SURGICAL/DEEP WOUND)

## 2020-03-16 ENCOUNTER — Encounter: Payer: Self-pay | Admitting: Family Medicine

## 2020-03-16 ENCOUNTER — Ambulatory Visit: Payer: Self-pay | Attending: Family Medicine | Admitting: Family Medicine

## 2020-03-16 ENCOUNTER — Other Ambulatory Visit: Payer: Self-pay

## 2020-03-16 VITALS — BP 139/87 | HR 90 | Ht 73.7 in | Wt 272.0 lb

## 2020-03-16 DIAGNOSIS — E119 Type 2 diabetes mellitus without complications: Secondary | ICD-10-CM

## 2020-03-16 DIAGNOSIS — Z09 Encounter for follow-up examination after completed treatment for conditions other than malignant neoplasm: Secondary | ICD-10-CM

## 2020-03-16 DIAGNOSIS — R0683 Snoring: Secondary | ICD-10-CM

## 2020-03-16 DIAGNOSIS — G589 Mononeuropathy, unspecified: Secondary | ICD-10-CM

## 2020-03-16 DIAGNOSIS — J36 Peritonsillar abscess: Secondary | ICD-10-CM

## 2020-03-16 LAB — GLUCOSE, POCT (MANUAL RESULT ENTRY): POC Glucose: 130 mg/dL — AB (ref 70–99)
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
## 2020-03-16 NOTE — Patient Instructions (Signed)
Carbohydrate Counting for Diabetes Mellitus, Adult  Carbohydrate counting is a method of keeping track of how many carbohydrates you eat. Eating carbohydrates naturally increases the amount of sugar (glucose) in the blood. Counting how many carbohydrates you eat helps keep your blood glucose within normal limits, which helps you manage your diabetes (diabetes mellitus). It is important to know how many carbohydrates you can safely have in each meal. This is different for every person. A diet and nutrition specialist (registered dietitian) can help you make a meal plan and calculate how many carbohydrates you should have at each meal and snack. Carbohydrates are found in the following foods:  Grains, such as breads and cereals.  Dried beans and soy products.  Starchy vegetables, such as potatoes, peas, and corn.  Fruit and fruit juices.  Milk and yogurt.  Sweets and snack foods, such as cake, cookies, candy, chips, and soft drinks. How do I count carbohydrates? There are two ways to count carbohydrates in food. You can use either of the methods or a combination of both. Reading "Nutrition Facts" on packaged food The "Nutrition Facts" list is included on the labels of almost all packaged foods and beverages in the [place] It includes:  The serving size.  Information about nutrients in each serving, including the grams (g) of carbohydrate per serving. To use the "Nutrition Facts":  Decide how many servings you will have.  Multiply the number of servings by the number of carbohydrates per serving.  The resulting number is the total amount of carbohydrates that you will be having. Learning standard serving sizes of other foods When you eat carbohydrate foods that are not packaged or do not include "Nutrition Facts" on the label, you need to measure the servings in order to count the amount of carbohydrates:  Measure the foods that you will eat with a food scale or measuring cup, if  needed.  Decide how many standard-size servings you will eat.  Multiply the number of servings by 15. Most carbohydrate-rich foods have about 15 g of carbohydrates per serving. ? For example, if you eat 8 oz (170 g) of strawberries, you will have eaten 2 servings and 30 g of carbohydrates (2 servings x 15 g = 30 g).  For foods that have more than one food mixed, such as soups and casseroles, you must count the carbohydrates in each food that is included. The following list contains standard serving sizes of common carbohydrate-rich foods. Each of these servings has about 15 g of carbohydrates:   hamburger bun or  English muffin.   oz (15 mL) syrup.   oz (14 g) jelly.  1 slice of bread.  1 six-inch tortilla.  3 oz (85 g) cooked rice or pasta.  4 oz (113 g) cooked dried beans.  4 oz (113 g) starchy vegetable, such as peas, corn, or potatoes.  4 oz (113 g) hot cereal.  4 oz (113 g) mashed potatoes or  of a large baked potato.  4 oz (113 g) canned or frozen fruit.  4 oz (120 mL) fruit juice.  4-6 crackers.  6 chicken nuggets.  6 oz (170 g) unsweetened dry cereal.  6 oz (170 g) plain fat-free yogurt or yogurt sweetened with artificial sweeteners.  8 oz (240 mL) milk.  8 oz (170 g) fresh fruit or one small piece of fruit.  24 oz (680 g) popped popcorn. Example of carbohydrate counting Sample meal  3 oz (85 g) chicken breast.  6 oz (170 g)   brown rice.  4 oz (113 g) corn.  8 oz (240 mL) milk.  8 oz (170 g) strawberries with sugar-free whipped topping. Carbohydrate calculation 1. Identify the foods that contain carbohydrates: ? Rice. ? Corn. ? Milk. ? Strawberries. 2. Calculate how many servings you have of each food: ? 2 servings rice. ? 1 serving corn. ? 1 serving milk. ? 1 serving strawberries. 3. Multiply each number of servings by 15 g: ? 2 servings rice x 15 g = 30 g. ? 1 serving corn x 15 g = 15 g. ? 1 serving milk x 15 g = 15 g. ? 1  serving strawberries x 15 g = 15 g. 4. Add together all of the amounts to find the total grams of carbohydrates eaten: ? 30 g + 15 g + 15 g + 15 g = 75 g of carbohydrates total. Summary  Carbohydrate counting is a method of keeping track of how many carbohydrates you eat.  Eating carbohydrates naturally increases the amount of sugar (glucose) in the blood.  Counting how many carbohydrates you eat helps keep your blood glucose within normal limits, which helps you manage your diabetes.  A diet and nutrition specialist (registered dietitian) can help you make a meal plan and calculate how many carbohydrates you should have at each meal and snack. This information is not intended to replace advice given to you by your health care provider. Make sure you discuss any questions you have with your health care provider. Document Revised: 11/10/2016 Document Reviewed: 09/30/2015 Elsevier Patient Education  2020 Kyle Newman.  Diabetes Basics  Diabetes (diabetes mellitus) is a long-term (chronic) disease. It occurs when the body does not properly use sugar (glucose) that is released from food after you eat. Diabetes may be caused by one or both of these problems:  Your pancreas does not make enough of a hormone called insulin.  Your body does not react in a normal way to insulin that it makes. Insulin lets sugars (glucose) go into cells in your body. This gives you energy. If you have diabetes, sugars cannot get into cells. This causes high blood sugar (hyperglycemia). Follow these instructions at home: How is diabetes treated? You may need to take insulin or other diabetes medicines daily to keep your blood sugar in balance. Take your diabetes medicines every day as told by your doctor. List your diabetes medicines here: Diabetes medicines  Name of medicine: ______________________________ ? Amount (dose): _______________ Time (a.m./p.m.): _______________ Notes:  ___________________________________  Name of medicine: ______________________________ ? Amount (dose): _______________ Time (a.m./p.m.): _______________ Notes: ___________________________________  Name of medicine: ______________________________ ? Amount (dose): _______________ Time (a.m./p.m.): _______________ Notes: ___________________________________ If you use insulin, you will learn how to give yourself insulin by injection. You may need to adjust the amount based on the food that you eat. List the types of insulin you use here: Insulin  Insulin type: ______________________________ ? Amount (dose): _______________ Time (a.m./p.m.): _______________ Notes: ___________________________________  Insulin type: ______________________________ ? Amount (dose): _______________ Time (a.m./p.m.): _______________ Notes: ___________________________________  Insulin type: ______________________________ ? Amount (dose): _______________ Time (a.m./p.m.): _______________ Notes: ___________________________________  Insulin type: ______________________________ ? Amount (dose): _______________ Time (a.m./p.m.): _______________ Notes: ___________________________________  Insulin type: ______________________________ ? Amount (dose): _______________ Time (a.m./p.m.): _______________ Notes: ___________________________________ How do I manage my blood sugar?  Check your blood sugar levels using a blood glucose monitor as directed by your doctor. Your doctor will set treatment goals for you. Generally, you should have these blood sugar levels:  Before meals (  preprandial): 80-130 mg/dL (4.4-7.2 mmol/L).  After meals (postprandial): below 180 mg/dL (10 mmol/L).  A1c level: less than 7%. Write down the times that you will check your blood sugar levels: Blood sugar checks  Time: _______________ Notes: ___________________________________  Time: _______________ Notes:  ___________________________________  Time: _______________ Notes: ___________________________________  Time: _______________ Notes: ___________________________________  Time: _______________ Notes: ___________________________________  Time: _______________ Notes: ___________________________________  What do I need to know about low blood sugar? Low blood sugar is called hypoglycemia. This is when blood sugar is at or below 70 mg/dL (3.9 mmol/L). Symptoms may include:  Feeling: ? Hungry. ? Worried or nervous (anxious). ? Sweaty and clammy. ? Confused. ? Dizzy. ? Sleepy. ? Sick to your stomach (nauseous).  Having: ? A fast heartbeat. ? A headache. ? A change in your vision. ? Tingling or no feeling (numbness) around the mouth, lips, or tongue. ? Jerky movements that you cannot control (seizure).  Having trouble with: ? Moving (coordination). ? Sleeping. ? Passing out (fainting). ? Getting upset easily (irritability). Treating low blood sugar To treat low blood sugar, eat or drink something sugary right away. If you can think clearly and swallow safely, follow the 15:15 rule:  Take 15 grams of a fast-acting carb (carbohydrate). Talk with your doctor about how much you should take.  Some fast-acting carbs are: ? Sugar tablets (glucose pills). Take 3-4 glucose pills. ? 6-8 pieces of hard candy. ? 4-6 oz (120-150 mL) of fruit juice. ? 4-6 oz (120-150 mL) of regular (not diet) soda. ? 1 Tbsp (15 mL) honey or sugar.  Check your blood sugar 15 minutes after you take the carb.  If your blood sugar is still at or below 70 mg/dL (3.9 mmol/L), take 15 grams of a carb again.  If your blood sugar does not go above 70 mg/dL (3.9 mmol/L) after 3 tries, get help right away.  After your blood sugar goes back to normal, eat a meal or a snack within 1 hour. Treating very low blood sugar If your blood sugar is at or below 54 mg/dL (3 mmol/L), you have very low blood sugar (severe  hypoglycemia). This is an emergency. Do not wait to see if the symptoms will go away. Get medical help right away. Call your local emergency services (911 in the [place] Do not drive yourself to the hospital. Questions to ask your health care provider  Do I need to meet with a diabetes educator?  What equipment will I need to care for myself at home?  What diabetes medicines do I need? When should I take them?  How often do I need to check my blood sugar?  What number can I call if I have questions?  When is my next doctor's visit?  Where can I find a support group for people with diabetes? Where to find more information  American Diabetes Association: www.diabetes.org  American Association of Diabetes Educators: www.diabeteseducator.org/patient-resources Contact a doctor if:  Your blood sugar is at or above 240 mg/dL (13.3 mmol/L) for 2 days in a row.  You have been sick or have had a fever for 2 days or more, and you are not getting better.  You have any of these problems for more than 6 hours: ? You cannot eat or drink. ? You feel sick to your stomach (nauseous). ? You throw up (vomit). ? You have watery poop (diarrhea). Get help right away if:  Your blood sugar is lower than 54 mg/dL (3 mmol/L).  You get  confused.  You have trouble: ? Thinking clearly. ? Breathing. Summary  Diabetes (diabetes mellitus) is a long-term (chronic) disease. It occurs when the body does not properly use sugar (glucose) that is released from food after digestion.  Take insulin and diabetes medicines as told.  Check your blood sugar every day, as often as told.  Keep all follow-up visits as told by your doctor. This is important. This information is not intended to replace advice given to you by your health care provider. Make sure you discuss any questions you have with your health care provider. Document Revised: 01/09/2019 Document Reviewed: 07/21/2017 Elsevier Patient Education   [place].

## 2020-03-16 NOTE — Progress Notes (Signed)
Has not taken Glucotrol in over 1 week Establish care  Diabetes control

## 2020-03-17 ENCOUNTER — Encounter: Payer: Self-pay | Admitting: Neurology

## 2020-03-22 ENCOUNTER — Encounter: Payer: Self-pay | Admitting: Family Medicine

## 2020-05-18 ENCOUNTER — Ambulatory Visit: Payer: Medicaid Other | Admitting: Family Medicine

## 2020-05-19 ENCOUNTER — Encounter: Payer: Self-pay | Admitting: Family Medicine

## 2020-05-19 ENCOUNTER — Ambulatory Visit: Payer: Self-pay | Attending: Family Medicine | Admitting: Family Medicine

## 2020-05-19 ENCOUNTER — Other Ambulatory Visit: Payer: Self-pay

## 2020-05-19 VITALS — BP 144/87 | HR 88 | Temp 98.4°F | Resp 18 | Ht 72.0 in | Wt 275.0 lb

## 2020-05-19 DIAGNOSIS — E119 Type 2 diabetes mellitus without complications: Secondary | ICD-10-CM

## 2020-05-19 LAB — POCT GLYCOSYLATED HEMOGLOBIN (HGB A1C): Hemoglobin A1C: 6.4 % — AB (ref 4.0–5.6)

## 2020-05-19 LAB — GLUCOSE, POCT (MANUAL RESULT ENTRY): POC Glucose: 95 mg/dl (ref 70–99)

## 2020-05-19 MED ORDER — ATORVASTATIN CALCIUM 20 MG PO TABS: 20.0000 mg | ORAL_TABLET | Freq: Every day | ORAL | 3 refills | Status: DC
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
## 2020-05-19 NOTE — Progress Notes (Signed)
Patient has not taken medication today ?Patient has eaten today. ?Patient denies pain at this time. ? ?

## 2020-05-20 LAB — MICROALBUMIN / CREATININE URINE RATIO
Creatinine, Urine: 72.5 mg/dL
Microalb/Creat Ratio: 14 mg/g creat (ref 0–29)
Microalbumin, Urine: 9.8 ug/mL

## 2020-05-21 ENCOUNTER — Encounter (HOSPITAL_BASED_OUTPATIENT_CLINIC_OR_DEPARTMENT_OTHER): Payer: Medicaid Other | Admitting: Internal Medicine

## 2020-05-27 ENCOUNTER — Ambulatory Visit (HOSPITAL_BASED_OUTPATIENT_CLINIC_OR_DEPARTMENT_OTHER): Payer: Self-pay | Attending: Family Medicine | Admitting: Internal Medicine

## 2020-05-27 ENCOUNTER — Other Ambulatory Visit: Payer: Self-pay

## 2020-05-27 DIAGNOSIS — R0683 Snoring: Secondary | ICD-10-CM | POA: Insufficient documentation

## 2020-05-28 ENCOUNTER — Other Ambulatory Visit (HOSPITAL_BASED_OUTPATIENT_CLINIC_OR_DEPARTMENT_OTHER): Payer: Self-pay

## 2020-05-28 DIAGNOSIS — R0683 Snoring: Secondary | ICD-10-CM

## 2020-05-31 DIAGNOSIS — R0683 Snoring: Secondary | ICD-10-CM

## 2020-05-31 NOTE — Procedures (Signed)
 Patient Name: Kyle Newman, Kyle Newman Study Date: 05/27/2020 Gender: Male D.O.B: 11/12/1965 Age (years): 54 Referring Provider: Cammie Fulp Height (inches): 73 Interpreting Physician: Clinton Young MD, ABSM Weight (lbs): 270 RPSGT: Ford, Evelyn BMI: 36 MRN: [id number] Neck Size: 18.00  CLINICAL INFORMATION Sleep Study Type: Split Night CPAP Indication for sleep study: OSA, Snoring Epworth Sleepiness Score: 6  SLEEP STUDY TECHNIQUE As per the AASM Manual for the Scoring of Sleep and Associated Events v2.3 (April 2016) with a hypopnea requiring 4% desaturations.  The channels recorded and monitored were frontal, central and occipital EEG, electrooculogram (EOG), submentalis EMG (chin), nasal and oral airflow, thoracic and abdominal wall motion, anterior tibialis EMG, snore microphone, electrocardiogram, and pulse oximetry. Continuous positive airway pressure (CPAP) was initiated when the patient met split night criteria and was titrated according to treat sleep-disordered breathing.  MEDICATIONS Medications self-administered by patient taken the night of the study : none reported  RESPIRATORY PARAMETERS Diagnostic  Total AHI (/hr): 76.7 RDI (/hr): 78.5 OA Index (/hr): 2.7 CA Index (/hr): 0.0 REM AHI (/hr): 10.9 NREM AHI (/hr): 82.7 Supine AHI (/hr): 82.4 Non-supine AHI (/hr): 53.3 Min O2 Sat (%): 75.0 Mean O2 (%): 87.2 Time below 88% (min): 75   Titration  Optimal Pressure (cm): 17 AHI at Optimal Pressure (/hr): 0.6 Min O2 at Optimal Pressure (%): 85.0 Supine % at Optimal (%): 100 Sleep % at Optimal (%): 81   SLEEP ARCHITECTURE The recording time for the entire night was 363.9 minutes.  During a baseline period of 162.3 minutes, the patient slept for 131.5 minutes in REM and nonREM, yielding a sleep efficiency of 81.0%%. Sleep onset after lights out was 4.2 minutes with a REM latency of 74.5 minutes. The patient spent 27.8%% of the night in stage N1 sleep, 63.9%% in stage N2 sleep,  0.0%% in stage N3 and 8.4% in REM.  During the titration period of 198.3 minutes, the patient slept for 165.5 minutes in REM and nonREM, yielding a sleep efficiency of 83.4%%. Sleep onset after CPAP initiation was 5.7 minutes with a REM latency of 107.0 minutes. The patient spent 7.9%% of the night in stage N1 sleep, 71.6%% in stage N2 sleep, 0.0%% in stage N3 and 20.5% in REM.  CARDIAC DATA The 2 lead EKG demonstrated sinus rhythm. The mean heart rate was 70.9 beats per minute. Other EKG findings include: None.  LEG MOVEMENT DATA The total Periodic Limb Movements of Sleep (PLMS) were 0. The PLMS index was 0.0 .  IMPRESSIONS - Severe obstructive sleep apnea occurred during the diagnostic portion of the study (AHI = 76.7/hour). An optimal PAP pressure was selected for this patient ( 17 cm of water) - No significant central sleep apnea occurred during the diagnostic portion of the study (CAI = 0.0/hour). - Moderate oxygen desaturation was noted during the diagnostic portion of the study (Min O2 =75.0%).  Mean O2 sat on CPAP 17 was 91.4%. - No snoring was audible during the diagnostic portion of the study. - No cardiac abnormalities were noted during this study. - Clinically significant periodic limb movements did not occur during sleep.  DIAGNOSIS - Obstructive Sleep Apnea (G47.33)  RECOMMENDATIONS - Trial of CPAP therapy on 17 cm H2O or autopap 10-20. - Patient used a Large size Resmed Full Face Mask AirFit F20 mask and heated humidification. - Be careful with alcohol, sedatives and other CNS depressants that may worsen sleep apnea and disrupt normal sleep architecture. - Sleep hygiene should be reviewed to assess factors that may   improve sleep quality. - Weight management and regular exercise should be initiated or continued.  [Electronically signed] 05/31/2020 12:39 PM  Clinton Young MD, ABSM Diplomate, American Board of Sleep Medicine   NPI: [id number]                          Clinton Young Diplomate, American Board of Sleep Medicine  ELECTRONICALLY SIGNED ON:  05/31/2020, 12:35 PM [facility] PH: [phone]   FX: [phone] ACCREDITED BY THE AMERICAN ACADEMY OF SLEEP MEDICINE 

## 2020-06-01 ENCOUNTER — Other Ambulatory Visit: Payer: Self-pay | Admitting: Family Medicine

## 2020-06-01 MED ORDER — MISC. DEVICES MISC: 0 refills | Status: DC

## 2020-06-05 ENCOUNTER — Telehealth: Payer: Self-pay

## 2020-06-05 NOTE — Telephone Encounter (Signed)
Attempted to contact the patient # [phone] to provide results of sleep study and recommendation for CPAP. Message left with call back requested to this CM.   He only has family planning medicaid per Epic, need to check to see if he has any other insurance.  Family Planning medicaid will not cover the cost of CPAP. If he doesn't have insurance and is unable to afford the machine, will inform him that this clinic is checking on possible funding to assist with the cost of the CPAP machine.

## 2020-06-15 ENCOUNTER — Other Ambulatory Visit: Payer: Self-pay

## 2020-06-15 ENCOUNTER — Ambulatory Visit (INDEPENDENT_AMBULATORY_CARE_PROVIDER_SITE_OTHER): Payer: Self-pay | Admitting: Neurology

## 2020-06-15 ENCOUNTER — Encounter: Payer: Self-pay | Admitting: Neurology

## 2020-06-15 ENCOUNTER — Telehealth: Payer: Self-pay | Admitting: Family Medicine

## 2020-06-15 VITALS — BP 166/99 | HR 94 | Ht 73.0 in | Wt 271.0 lb

## 2020-06-15 DIAGNOSIS — M21372 Foot drop, left foot: Secondary | ICD-10-CM

## 2020-06-15 DIAGNOSIS — R29898 Other symptoms and signs involving the musculoskeletal system: Secondary | ICD-10-CM

## 2020-06-15 DIAGNOSIS — L84 Corns and callosities: Secondary | ICD-10-CM

## 2020-06-15 NOTE — Telephone Encounter (Signed)
Pt needs new referral, last referral as placed by Dr.Fulp.
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
## 2020-06-15 NOTE — Telephone Encounter (Signed)
Patient called back attempting to speak with you. Please follow up.

## 2020-06-15 NOTE — Telephone Encounter (Signed)
Copied from [place] [id number]. Topic: General - Other >> Jun 15, 2020  3:18 PM Leward Quan A wrote: Reason for CRM: Patient called in to inquire who to speak to about an appointment that he went on today to a Neurologist. Per patient he was to see a podiatrist and is having issues with wanting his money back and getting the correct referral to the podiatrist. Also stated that Opal Sidles called him but was not able to speak to her. Please advise Please advise Ph#  [phone]

## 2020-06-15 NOTE — Telephone Encounter (Signed)
Call placed to patient and explained  the results of his sleep study and recommendation for CPAP.  He currently only has family planning medicaid which does not pay for DME.  Informed him that this clinic is checking on possible funding to assist with the cost of the machine, He said he is currently struggling with finances.   He then explained that he didn't realize he only had family planning medicaid until today. He went to the neurologist and had to pay $120 out of pocket to be seen. He then stated that he thought he was going to a podiatrist/  He said he has been dealing with nerve damage to his left  foot for 25 years and does not need to see a neurologist, he needs a podiatrist to remove callus on his foot.    the neurologist has since placed a referral to podiatry.  He is wondering what happened to the podiatry referral made by Dr Chapman Fitch and he would like to be reimbursed by [facility] for the out of pocket expense today.  Informed him that Dr Margarita Rana and Alinda Sierras Soler/referal specialist would be notified of his concern
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
## 2020-06-16 NOTE — Telephone Encounter (Signed)
Referral was already placed by neurologist at his visit.

## 2020-06-25 ENCOUNTER — Ambulatory Visit: Payer: Self-pay | Admitting: Podiatrist

## 2020-06-29 ENCOUNTER — Ambulatory Visit (INDEPENDENT_AMBULATORY_CARE_PROVIDER_SITE_OTHER): Payer: Self-pay | Admitting: Podiatry

## 2020-06-29 ENCOUNTER — Encounter: Payer: Self-pay | Admitting: Podiatry

## 2020-06-29 ENCOUNTER — Other Ambulatory Visit: Payer: Self-pay

## 2020-06-29 DIAGNOSIS — L600 Ingrowing nail: Secondary | ICD-10-CM

## 2020-06-29 DIAGNOSIS — L84 Corns and callosities: Secondary | ICD-10-CM

## 2020-06-29 NOTE — Progress Notes (Signed)
Subjective:   Patient ID: Kyle Newman, male   DOB: 55 y.o.   MRN: [id number]   HPI Pain should presents with thick toenails ingrown toenails of the lateral right and medial lateral left hallux with lesions left hallux second toe that are sore and history of foot drop left side with neurological damage to the back   Review of Systems  All other systems reviewed and are negative.       Objective:  Physical Exam Vitals and nursing note reviewed.  Constitutional:      Appearance: He is well-developed and well-nourished.  Cardiovascular:     Pulses: Intact distal pulses.  Pulmonary:     Effort: Pulmonary effort is normal.  Musculoskeletal:        General: Normal range of motion.  Skin:    General: Skin is warm.  Neurological:     Mental Status: He is alert.     Neurovascular status was found to be intact muscle strength was found to be adequate range of motion adequate.  Patient is noted to have thick lesions hallux left second digit and ingrown toenail deformity right hallux lateral left hallux medial lateral that are painful when pressed     Assessment:  Lesions formation secondary to foot structure along with mycotic nails with ingrown toenail component     Plan:  H&P educated on ingrown toenail he wants these corrected but is going to reschedule today I debrided the nailbed trying to take out the corners best as possible debrided the lesions left there was some bleeding on the left hallux as his skin is so thin I applied sterile dressing with Neosporin instructed on soaks reappoint

## 2020-07-06 ENCOUNTER — Encounter: Payer: Self-pay | Admitting: Podiatry

## 2020-07-06 ENCOUNTER — Ambulatory Visit (INDEPENDENT_AMBULATORY_CARE_PROVIDER_SITE_OTHER): Payer: Self-pay | Admitting: Podiatry

## 2020-07-06 ENCOUNTER — Other Ambulatory Visit: Payer: Self-pay

## 2020-07-06 DIAGNOSIS — L600 Ingrowing nail: Secondary | ICD-10-CM

## 2020-07-06 NOTE — Patient Instructions (Addendum)

## 2020-07-06 NOTE — Progress Notes (Signed)
Subjective:   Patient ID: Teresa Coombs, male   DOB: 55 y.o.   MRN: [id number]   HPI Patient presents for correction of ingrown toenail on both big toes stating that they get sore and he points the lateral border right hallux medial border left hallux   ROS      Objective:  Physical Exam  Neurovascular status intact with patient found to have an incurvated nail bed right hallux lateral border left hallux medial border with both of them being very tender and making it hard to wear shoe gear comfortably and he cannot get them out himself with no redness no drainage noted     Assessment:  Chronic ingrown toenail deformity hallux bilateral     Plan:  H&P recommended and discussed correction he wants to have this done and I allowed him to go over consent form for correction understanding risk.  Today I infiltrated each hallux 60 mg like Marcaine mixture sterile prep done and using sterile instrumentation removed lateral border right hallux medial border left hallux exposed matrix applied phenol 3 applications 30 seconds followed by alcohol lavage and sterile dressing.  Gave instructions on soaks and reappoint and encouraged him to leave dressing on 24 hours but take it off earlier if it should start to throb and encouraged him to call with any questions concerns which may arise

## 2020-07-07 ENCOUNTER — Other Ambulatory Visit: Payer: Self-pay

## 2020-07-07 ENCOUNTER — Ambulatory Visit: Payer: Self-pay | Admitting: Podiatry

## 2020-07-09 ENCOUNTER — Ambulatory Visit: Payer: Self-pay | Admitting: Podiatry

## 2020-07-13 ENCOUNTER — Ambulatory Visit: Payer: Self-pay | Admitting: Podiatry

## 2020-12-23 ENCOUNTER — Telehealth: Payer: Self-pay

## 2020-12-23 NOTE — Telephone Encounter (Signed)
Patient in need of CPAP machine. He only has family planning Medicaid and he is unemployed. At some point, he said he will need to obtain another insurance.    Informed him about the [employer] (ASAA) CPAP Assistance Program now that it is up and running again post COVID.   They provide refurbished machines to individuals for a $100 program fee. There is no warranty or technical support that comes with the machine, it is provided " as is."    The program currently reports a wait of about 3.5-4 months for a machine to be available for delivery. The patient said that he is able to afford the program fee at this time.  Informed him that [facility] will contact him for payment. If he has difficulty paying at the time the fee is due, [facility] has funding available to assist with this.  He agreed to have  the machine delivered to [facility] and when  he picks up the machine at [facility], he will be asked to sign a waiver of liability for the ASAA     He was in agreement with submitting  the order for the CPAP machine to [employer]AA. He said that a friend was going to give him a CPAP machine but he was hesitant to use it as he is not sure how clean the machine is.  He was very pleased with this CAP assistance program.

## 2020-12-31 ENCOUNTER — Telehealth: Payer: Self-pay

## 2020-12-31 NOTE — Telephone Encounter (Signed)
Order for CPAP machine has been faxed to [employer] - CPAP assistance program

## 2021-02-24 ENCOUNTER — Other Ambulatory Visit: Payer: Self-pay

## 2021-02-24 ENCOUNTER — Encounter (HOSPITAL_COMMUNITY): Payer: Self-pay | Admitting: *Deleted

## 2021-02-24 ENCOUNTER — Emergency Department (HOSPITAL_COMMUNITY)
Admission: EM | Admit: 2021-02-24 | Discharge: 2021-02-24 | Disposition: A | Discharge status: Home / Self Care | Payer: Medicaid Other | Attending: Emergency Medicine | Admitting: Emergency Medicine

## 2021-02-24 DIAGNOSIS — M2550 Pain in unspecified joint: Secondary | ICD-10-CM | POA: Insufficient documentation

## 2021-02-24 DIAGNOSIS — R509 Fever, unspecified: Secondary | ICD-10-CM | POA: Insufficient documentation

## 2021-02-24 DIAGNOSIS — J111 Influenza due to unidentified influenza virus with other respiratory manifestations: Secondary | ICD-10-CM

## 2021-02-24 DIAGNOSIS — Z20822 Contact with and (suspected) exposure to covid-19: Secondary | ICD-10-CM | POA: Insufficient documentation

## 2021-02-24 LAB — RESP PANEL BY RT-PCR (FLU A&B, COVID) ARPGX2
Influenza A by PCR: POSITIVE — AB
Influenza B by PCR: NEGATIVE
SARS Coronavirus 2 by RT PCR: NEGATIVE

## 2021-02-24 MED ORDER — NEOMYCIN-POLYMYXIN-HC 3.5-10000-1 OT SUSP: 4.0000 [drp] | Freq: Three times a day (TID) | OTIC | 0 refills | Status: AC

## 2021-02-24 MED ORDER — ACETAMINOPHEN 325 MG PO TABS
650.0000 mg | ORAL_TABLET | Freq: Once | ORAL | Status: AC
  Administered 2021-02-24: 10:00:00 650 mg via ORAL
  Filled 2021-02-24: qty 2

## 2021-02-24 MED ORDER — OSELTAMIVIR PHOSPHATE 75 MG PO CAPS: 75.0000 mg | ORAL_CAPSULE | Freq: Two times a day (BID) | ORAL | 0 refills | Status: DC

## 2021-02-24 MED ORDER — ACETAMINOPHEN 500 MG PO TABS
1000.0000 mg | ORAL_TABLET | Freq: Once | ORAL | Status: AC
  Administered 2021-02-24: 18:00:00 1000 mg via ORAL
  Filled 2021-02-24: qty 2

## 2021-02-24 MED ORDER — BENZONATATE 100 MG PO CAPS: 100.0000 mg | ORAL_CAPSULE | Freq: Three times a day (TID) | ORAL | 0 refills | Status: DC

## 2021-02-24 NOTE — ED Triage Notes (Signed)
Pt has multiple complaints, including bodyaches, headache, right ear pain, fever, back pain, groin pain.

## 2021-02-24 NOTE — Discharge Instructions (Signed)
Your COVID and flu testing will not come back today.  You can look for these results in your chart online or expect a call if anything is positive.  I have sent Tamiflu to your pharmacy.  Please only use this if you test positive for the flu.  It is important that you start this medication within the first 3 days of your symptoms, so if you see a positive result in the next 1 to 2 days, you should be in the clear to use this medication.  I am attaching information about the flu to your discharge papers.  If you happen to test positive for COVID-19, please isolate in accordance with the CDC precautions.  Regardless of your test results, you may utilize over-the-counter remedies such as Tylenol, Advil and any other flu medications that you see fit.  Delsym may help with your cough, but I am also sending a prescription cough medication to your pharmacy that you may utilize if you like.  You also appear to have a right sided ear infection.  I am sending antibiotic drops to the pharmacy for you to pick up and use.  He will use these drops 3 times a day for the next 7 days. Please return if you have chest pain, shortness of breath, feelings as though you may pass out or an overall worsening of your condition. It was a pleasure to meet you and I hope that you feel better!

## 2021-02-24 NOTE — ED Provider Notes (Signed)
Emergency Medicine Provider Triage Evaluation Note  Kyle Newman , a 55 y.o. male  was evaluated in triage.  Pt complains of flulike symptoms onset last night with fever, body aches, cough (productive with clear sputum).  Patient has not taken anything for his fever.  Is a smoker, no formal diagnosis of COPD, history of asthma.  Exposed to someone recently who was sick with similar symptoms.  Review of Systems  Positive: Body aches, fever, cough Negative: Vomiting, diarrhea  Physical Exam  BP (!) 165/93 (BP Location: Left Arm)   Pulse 93   Temp (!) 102.7 F (39.3 C)   Resp 20   SpO2 96%  Gen:   Awake, no distress   Resp:  Normal effort  MSK:   Moves extremities without difficulty  Other:    Medical Decision Making  Medically screening exam initiated at 10:01 AM.  Appropriate orders placed.  Kyle Newman was informed that the remainder of the evaluation will be completed by another provider, this initial triage assessment does not replace that evaluation, and the importance of remaining in the ED until their evaluation is complete.     Kyle Learn, PA-C 02/24/21 1002    Kyle Boss, MD 02/25/21 [id number]
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
## 2021-02-24 NOTE — ED Notes (Signed)
New covid/flu swab sent. [facility] called

## 2021-03-26 ENCOUNTER — Emergency Department (HOSPITAL_COMMUNITY): Payer: Self-pay

## 2021-03-26 ENCOUNTER — Emergency Department (HOSPITAL_COMMUNITY)
Admission: EM | Admit: 2021-03-26 | Discharge: 2021-03-26 | Disposition: A | Discharge status: Home / Self Care | Payer: Self-pay | Attending: Emergency Medicine | Admitting: Emergency Medicine

## 2021-03-26 ENCOUNTER — Other Ambulatory Visit: Payer: Self-pay

## 2021-03-26 ENCOUNTER — Encounter (HOSPITAL_COMMUNITY): Payer: Self-pay

## 2021-03-26 DIAGNOSIS — M25511 Pain in right shoulder: Secondary | ICD-10-CM | POA: Insufficient documentation

## 2021-03-26 DIAGNOSIS — F17210 Nicotine dependence, cigarettes, uncomplicated: Secondary | ICD-10-CM | POA: Insufficient documentation

## 2021-03-26 DIAGNOSIS — S238XXA Sprain of other specified parts of thorax, initial encounter: Secondary | ICD-10-CM | POA: Insufficient documentation

## 2021-03-26 DIAGNOSIS — W19XXXA Unspecified fall, initial encounter: Secondary | ICD-10-CM

## 2021-03-26 DIAGNOSIS — R519 Headache, unspecified: Secondary | ICD-10-CM | POA: Insufficient documentation

## 2021-03-26 DIAGNOSIS — W108XXA Fall (on) (from) other stairs and steps, initial encounter: Secondary | ICD-10-CM | POA: Insufficient documentation

## 2021-03-26 DIAGNOSIS — M25521 Pain in right elbow: Secondary | ICD-10-CM | POA: Insufficient documentation

## 2021-03-26 DIAGNOSIS — S5002XA Contusion of left elbow, initial encounter: Secondary | ICD-10-CM | POA: Insufficient documentation

## 2021-03-26 DIAGNOSIS — S239XXA Sprain of unspecified parts of thorax, initial encounter: Secondary | ICD-10-CM

## 2021-03-26 DIAGNOSIS — E119 Type 2 diabetes mellitus without complications: Secondary | ICD-10-CM | POA: Insufficient documentation

## 2021-03-26 DIAGNOSIS — S5000XA Contusion of unspecified elbow, initial encounter: Secondary | ICD-10-CM

## 2021-03-26 MED ORDER — METHOCARBAMOL 500 MG PO TABS: 500.0000 mg | ORAL_TABLET | Freq: Three times a day (TID) | ORAL | 0 refills | Status: DC | PRN

## 2021-03-26 NOTE — ED Triage Notes (Signed)
Pt reports fall 1 week ago while coming down the stairs, hit his head and back on the floor. Denies LOC. Pt c.o headache, right eye pain, radiating down his right arm, describes it as nerve pain. Pt a.o, ambulatory
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
## 2021-03-26 NOTE — ED Provider Notes (Signed)
Emergency Medicine Provider Triage Evaluation Note  Kyle Newman , a 55 y.o. male  was evaluated in triage.  Pt complains of fall onset 1 week.  He reports that he was going up stairs when he slipped and hit his head and left side of the body on the floor.  Today he is complaining of left elbow pain, right sided headache, and upper back pain.  He has not tried any medications for his symptoms.  Patient has a prior history of headaches with this 1 being in similar distribution to prior.  Denies LOC, vomiting, nausea, chest pain, shortness of breath.   Review of Systems  Positive: Headache, left elbow pain Negative: Chest pain, shortness of breath  Physical Exam  BP (!) 181/114 (BP Location: Right Arm)   Pulse 94   Temp 98.5 F (36.9 C) (Oral)   Resp 16   SpO2 97%  Gen:   Awake, no distress   Resp:  Normal effort  MSK:   Moves extremities without difficulty  Other:  Minimal tenderness to palpation to thoracic musculature.   Medical Decision Making  Medically screening exam initiated at 11:41 AM.  Appropriate orders placed.  Thatcher Doberstein was informed that the remainder of the evaluation will be completed by another provider, this initial triage assessment does not replace that evaluation, and the importance of remaining in the ED until their evaluation is complete.    Jhase Creppel A, PA-C 03/26/21 1250    Jeanell Sparrow, DO 03/27/21 1559

## 2021-04-22 ENCOUNTER — Telehealth: Payer: Self-pay

## 2021-04-22 NOTE — Telephone Encounter (Signed)
The phone number for the [employer] was text to the patient as he requested.

## 2021-04-22 NOTE — Telephone Encounter (Signed)
Call placed to patient and informed him that the [employer] - CPAP assistance program has been trying to reach him.  They have a CPAP machine available for him but they need payment prior to shipping it.   He said that he received a call from them but lost the contact number. He also noted that he has $100 to pay the program fee. He understands that after he pays the fee, the machine will be delivered to [facility] and we will contact him to schedule CPAP teaching and pick up.   He said he would call [employer] today, tomorrow at the latest.

## 2021-06-28 ENCOUNTER — Telehealth: Payer: Self-pay

## 2021-06-28 NOTE — Telephone Encounter (Signed)
Call placed to patient and informed him that the [employer] - CPAP Assistance Program has been trying to reach him about paying for the CPAP machine. He said he has been struggling financially and would not be able to pay at this time.  I explained to him that [facility] has funding that will be able to pay the fee for him.  He was very Patent attorney. Informed him that I will be in touch with him when the machine is delivered to this clinic.   Email sent to [facility]/[employer]- CPAP Assistance Program requesting she contact this CM for payment for the patient's CPAP machine.

## 2021-06-28 NOTE — Telephone Encounter (Signed)
Call received from [facility] stating that they have a CPAP machine ready for delivery. This CM paid $100 program fee for patient with [facility] funds. Alice confirmed that the machine will be delivered to [facility] [address], [address], [place], [place]

## 2021-07-07 ENCOUNTER — Telehealth: Payer: Self-pay

## 2021-07-07 NOTE — Telephone Encounter (Signed)
Attempted to contact  patient  # [phone] to informed him that the CPAP machine has been delivered and the [facility] will be calling him to schedule a time to come in for teaching and to pick up the machine. Message left with call back requested to this CM  ?

## 2021-07-08 NOTE — Telephone Encounter (Signed)
Attempted again to contact  patient  # [phone] to informed him that the CPAP machine has been delivered and the [facility] will be calling him to schedule a time to come in for teaching and to pick up the machine. Message left with call back requested to this CM  ?

## 2021-07-16 ENCOUNTER — Other Ambulatory Visit (HOSPITAL_BASED_OUTPATIENT_CLINIC_OR_DEPARTMENT_OTHER): Payer: Medicaid Other | Admitting: Internal Medicine

## 2021-07-23 ENCOUNTER — Other Ambulatory Visit (HOSPITAL_BASED_OUTPATIENT_CLINIC_OR_DEPARTMENT_OTHER): Payer: Medicaid Other | Admitting: Internal Medicine

## 2021-08-19 ENCOUNTER — Telehealth: Payer: Self-pay

## 2021-08-19 NOTE — Telephone Encounter (Signed)
Call placed to patient to inquire why he has not completed the CPAP teaching and picked up his machine.  He said that he knows he missed his appointment a few times and he could not find the phone number for the clinic.  I encouraged him to [facility] the [facility] and re-schedule his appointment and provided him with the number.  ?

## 2021-08-27 ENCOUNTER — Other Ambulatory Visit (HOSPITAL_BASED_OUTPATIENT_CLINIC_OR_DEPARTMENT_OTHER): Payer: Medicaid Other | Admitting: Internal Medicine

## 2021-09-03 ENCOUNTER — Ambulatory Visit (HOSPITAL_BASED_OUTPATIENT_CLINIC_OR_DEPARTMENT_OTHER): Payer: Medicaid Other | Attending: Internal Medicine | Admitting: Internal Medicine

## 2021-09-03 DIAGNOSIS — R0683 Snoring: Secondary | ICD-10-CM

## 2021-09-07 ENCOUNTER — Other Ambulatory Visit: Payer: Self-pay

## 2021-09-07 ENCOUNTER — Emergency Department (HOSPITAL_COMMUNITY)
Admission: EM | Admit: 2021-09-07 | Discharge: 2021-09-08 | Disposition: A | Discharge status: Home / Self Care | Payer: Self-pay | Attending: Emergency Medicine | Admitting: Emergency Medicine

## 2021-09-07 ENCOUNTER — Encounter (HOSPITAL_COMMUNITY): Payer: Self-pay

## 2021-09-07 DIAGNOSIS — E278 Other specified disorders of adrenal gland: Secondary | ICD-10-CM

## 2021-09-07 DIAGNOSIS — R1033 Periumbilical pain: Secondary | ICD-10-CM | POA: Insufficient documentation

## 2021-09-07 DIAGNOSIS — Q790 Congenital diaphragmatic hernia: Secondary | ICD-10-CM

## 2021-09-07 LAB — COMPREHENSIVE METABOLIC PANEL
ALT: 30 U/L (ref 0–44)
AST: 20 U/L (ref 15–41)
Albumin: 3.5 g/dL (ref 3.5–5.0)
Alkaline Phosphatase: 66 U/L (ref 38–126)
Anion gap: 7 (ref 5–15)
BUN: 18 mg/dL (ref 6–20)
CO2: 24 mmol/L (ref 22–32)
Calcium: 9.2 mg/dL (ref 8.9–10.3)
Chloride: 108 mmol/L (ref 98–111)
Creatinine, Ser: 1.14 mg/dL (ref 0.61–1.24)
GFR, Estimated: >60 mL/min (ref >60)
Glucose, Bld: 118 mg/dL — ABNORMAL HIGH (ref 70–99)
Potassium: 3.5 mmol/L (ref 3.5–5.1)
Sodium: 139 mmol/L (ref 135–145)
Total Bilirubin: 0.8 mg/dL (ref 0.3–1.2)
Total Protein: 6.8 g/dL (ref 6.5–8.1)

## 2021-09-07 LAB — URINALYSIS, ROUTINE W REFLEX MICROSCOPIC
Bilirubin Urine: NEGATIVE
Glucose, UA: NEGATIVE mg/dL
Hgb urine dipstick: NEGATIVE
Ketones, ur: NEGATIVE mg/dL
Nitrite: NEGATIVE
Protein, ur: NEGATIVE mg/dL
Specific Gravity, Urine: 1.031 — ABNORMAL HIGH (ref 1.005–1.030)
pH: 5 (ref 5.0–8.0)

## 2021-09-07 LAB — CBC
HCT: 43.3 % (ref 39.0–52.0)
Hemoglobin: 14.5 g/dL (ref 13.0–17.0)
MCH: 30.9 pg (ref 26.0–34.0)
MCHC: 33.5 g/dL (ref 30.0–36.0)
MCV: 92.3 fL (ref 80.0–100.0)
Platelets: 320 10*3/uL (ref 150–400)
RBC: 4.69 MIL/uL (ref 4.22–5.81)
RDW: 13.9 % (ref 11.5–15.5)
WBC: 7 10*3/uL (ref 4.0–10.5)
nRBC: 0 % (ref 0.0–0.2)

## 2021-09-07 LAB — LIPASE, BLOOD: Lipase: 25 U/L (ref 11–51)

## 2021-09-07 NOTE — ED Triage Notes (Signed)
Pt complains of 3 days of generalized abdominal pain.  Denies any nausea vomiting diarrhea chest pain or difficulty breathing.  States he has had normal bowel movements and no urinary symptoms. ?
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
## 2021-09-08 ENCOUNTER — Emergency Department (HOSPITAL_COMMUNITY): Payer: Self-pay

## 2021-09-08 ENCOUNTER — Ambulatory Visit: Payer: Self-pay

## 2021-09-08 MED ORDER — IOHEXOL 300 MG/ML  SOLN
100.0000 mL | Freq: Once | INTRAMUSCULAR | Status: AC | PRN
  Administered 2021-09-08: 100 mL via INTRAVENOUS

## 2021-09-08 MED ORDER — FENTANYL CITRATE PF 50 MCG/ML IJ SOSY
50.0000 ug | PREFILLED_SYRINGE | Freq: Once | INTRAMUSCULAR | Status: AC
  Administered 2021-09-08: 50 ug via INTRAVENOUS
  Filled 2021-09-08: qty 1

## 2021-09-08 NOTE — Telephone Encounter (Signed)
Noted pt has an appt with Dr Wynetta Emery 5/11 ?

## 2021-09-08 NOTE — Telephone Encounter (Signed)
?  Chief Complaint: Severe abdominal pain ?Symptoms: Severe ?Frequency: 3 days ?Pertinent Negatives: Patient denies fever, diarrhea vomiting fever ?Disposition: '[x]'$ ED /'[]'$ Urgent Care (no appt availability in office) / '[]'$ Appointment(In office/virtual)/ '[]'$  [facility]/ '[]'$ Home Care/ '[]'$ Refused Recommended Disposition /'[]'$  Mobile Bus/ '[]'$  Follow-up with PCP ?Additional Notes: Pt states pain started 3 days ago and has continued. PT just left ED where he was seen and no clear diagnosis for pain was made.  PT stated that with previous abdominal pain an MRI was needed to find hernia mesh repair. ? ?PT states that he found short term relief with antacids. ? ?Reason for Disposition ? [1] SEVERE pain (e.g., excruciating) AND [2] present > 1 hour ? ?Answer Assessment - Initial Assessment Questions ?1. LOCATION: "Where does it hurt?"  ?    abdomen ?2. RADIATION: "Does the pain shoot anywhere else?" (e.g., chest, back) ?    no ?3. ONSET: "When did the pain begin?" (Minutes, hours or days ago)  ?    3 days ?4. SUDDEN: "Gradual or sudden onset?" ?    sudden ?5. PATTERN "Does the pain come and go, or is it constant?" ?   - If constant: "Is it getting better, staying the same, or worsening?"  ?    (Note: Constant means the pain never goes away completely; most serious pain is constant and it progresses)  ?   - If intermittent: "How long does it last?" "Do you have pain now?" ?    (Note: Intermittent means the pain goes away completely between bouts) ?    constant ?6. SEVERITY: "How bad is the pain?"  (e.g., Scale 1-10; mild, moderate, or severe) ?   - MILD (1-3): doesn't interfere with normal activities, abdomen soft and not tender to touch  ?   - MODERATE (4-7): interferes with normal activities or awakens from sleep, abdomen tender to touch  ?   - SEVERE (8-10): excruciating pain, doubled over, unable to do any normal activities   ?    8/10 ?7. RECURRENT SYMPTOM: "Have you ever had this type of stomach pain  before?" If Yes, ask: "When was the last time?" and "What happened that time?"  ?    yes ?8. CAUSE: "What do you think is causing the stomach pain?" ?    Hernia mesh infection ?9. RELIEVING/AGGRAVATING FACTORS: "What makes it better or worse?" (e.g., movement, antacids, bowel movement) ?    *No Answer* ?10. OTHER SYMPTOMS: "Do you have any other symptoms?" (e.g., back pain, diarrhea, fever, urination pain, vomiting) ?      *No Answer* ? ?Protocols used: Abdominal Pain - Male-A-AH ? ?

## 2021-09-08 NOTE — ED Notes (Signed)
Patient asleep.
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
## 2021-09-09 ENCOUNTER — Encounter: Payer: Self-pay | Admitting: Internal Medicine

## 2021-09-09 ENCOUNTER — Ambulatory Visit: Payer: Self-pay | Attending: Internal Medicine | Admitting: Internal Medicine

## 2021-09-09 VITALS — BP 142/96 | HR 89 | Temp 98.8°F | Wt 257.4 lb

## 2021-09-09 DIAGNOSIS — I10 Essential (primary) hypertension: Secondary | ICD-10-CM | POA: Insufficient documentation

## 2021-09-09 DIAGNOSIS — E785 Hyperlipidemia, unspecified: Secondary | ICD-10-CM | POA: Insufficient documentation

## 2021-09-09 DIAGNOSIS — D3500 Benign neoplasm of unspecified adrenal gland: Secondary | ICD-10-CM | POA: Insufficient documentation

## 2021-09-09 DIAGNOSIS — Z79899 Other long term (current) drug therapy: Secondary | ICD-10-CM | POA: Insufficient documentation

## 2021-09-09 DIAGNOSIS — F17210 Nicotine dependence, cigarettes, uncomplicated: Secondary | ICD-10-CM | POA: Insufficient documentation

## 2021-09-09 DIAGNOSIS — E119 Type 2 diabetes mellitus without complications: Secondary | ICD-10-CM | POA: Insufficient documentation

## 2021-09-09 DIAGNOSIS — R1084 Generalized abdominal pain: Secondary | ICD-10-CM | POA: Insufficient documentation

## 2021-09-09 DIAGNOSIS — E278 Other specified disorders of adrenal gland: Secondary | ICD-10-CM

## 2021-09-09 DIAGNOSIS — Z760 Encounter for issue of repeat prescription: Secondary | ICD-10-CM | POA: Insufficient documentation

## 2021-09-09 DIAGNOSIS — Z9081 Acquired absence of spleen: Secondary | ICD-10-CM | POA: Insufficient documentation

## 2021-09-09 MED ORDER — AMLODIPINE BESYLATE 5 MG PO TABS: 5.0000 mg | ORAL_TABLET | Freq: Every day | ORAL | 2 refills | Status: DC

## 2021-09-09 MED ORDER — OMEPRAZOLE 20 MG PO CPDR: 20.0000 mg | DELAYED_RELEASE_CAPSULE | Freq: Every day | ORAL | 1 refills | Status: AC
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
## 2021-11-08 ENCOUNTER — Ambulatory Visit: Payer: Medicaid Other | Admitting: Family Medicine

## 2022-07-06 ENCOUNTER — Ambulatory Visit: Payer: Medicaid Other | Attending: Family Medicine | Admitting: Family Medicine

## 2022-07-06 ENCOUNTER — Encounter: Payer: Self-pay | Admitting: Family Medicine

## 2022-07-06 VITALS — BP 153/95 | HR 92 | Ht 73.0 in | Wt 258.8 lb

## 2022-07-06 DIAGNOSIS — E1169 Type 2 diabetes mellitus with other specified complication: Secondary | ICD-10-CM | POA: Diagnosis not present

## 2022-07-06 DIAGNOSIS — G629 Polyneuropathy, unspecified: Secondary | ICD-10-CM | POA: Diagnosis not present

## 2022-07-06 DIAGNOSIS — E119 Type 2 diabetes mellitus without complications: Secondary | ICD-10-CM | POA: Insufficient documentation

## 2022-07-06 DIAGNOSIS — Z79899 Other long term (current) drug therapy: Secondary | ICD-10-CM | POA: Insufficient documentation

## 2022-07-06 DIAGNOSIS — I10 Essential (primary) hypertension: Secondary | ICD-10-CM | POA: Insufficient documentation

## 2022-07-06 DIAGNOSIS — M792 Neuralgia and neuritis, unspecified: Secondary | ICD-10-CM

## 2022-07-06 DIAGNOSIS — Z91148 Patient's other noncompliance with medication regimen for other reason: Secondary | ICD-10-CM | POA: Diagnosis not present

## 2022-07-06 LAB — POCT GLYCOSYLATED HEMOGLOBIN (HGB A1C): HbA1c, POC (controlled diabetic range): 6.4 % (ref 0.0–7.0)

## 2022-07-06 MED ORDER — ATORVASTATIN CALCIUM 20 MG PO TABS: 20.0000 mg | ORAL_TABLET | Freq: Every day | ORAL | 1 refills | Status: DC

## 2022-07-06 MED ORDER — AMLODIPINE BESYLATE 5 MG PO TABS: 5.0000 mg | ORAL_TABLET | Freq: Every day | ORAL | 1 refills | Status: DC

## 2022-07-06 MED ORDER — DULOXETINE HCL 60 MG PO CPEP: 60.0000 mg | ORAL_CAPSULE | Freq: Every day | ORAL | 3 refills | Status: AC
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
## 2022-07-06 NOTE — Patient Instructions (Signed)
Managing Your Hypertension Hypertension, also called high blood pressure, is when the force of the blood pressing against the walls of the arteries is too strong. Arteries are blood vessels that carry blood from your heart throughout your body. Hypertension forces the heart to work harder to pump blood and may cause the arteries to become narrow or stiff. Understanding blood pressure readings A blood pressure reading includes a higher number over a lower number: The first, or top, number is called the systolic pressure. It is a measure of the pressure in your arteries as your heart beats. The second, or bottom number, is called the diastolic pressure. It is a measure of the pressure in your arteries as the heart relaxes. For most people, a normal blood pressure is below 120/80. Your personal target blood pressure may vary depending on your medical conditions, your age, and other factors. Blood pressure is classified into four stages. Based on your blood pressure reading, your health care provider may use the following stages to determine what type of treatment you need, if any. Systolic pressure and diastolic pressure are measured in a unit called millimeters of mercury (mmHg). Normal Systolic pressure: below 120. Diastolic pressure: below 80. Elevated Systolic pressure: 120-129. Diastolic pressure: below 80. Hypertension stage 1 Systolic pressure: 130-139. Diastolic pressure: 80-89. Hypertension stage 2 Systolic pressure: 140 or above. Diastolic pressure: 90 or above. How can this condition affect me? Managing your hypertension is very important. Over time, hypertension can damage the arteries and decrease blood flow to parts of the body, including the brain, heart, and kidneys. Having untreated or uncontrolled hypertension can lead to: A heart attack. A stroke. A weakened blood vessel (aneurysm). Heart failure. Kidney damage. Eye damage. Memory and concentration problems. Vascular  dementia. What actions can I take to manage this condition? Hypertension can be managed by making lifestyle changes and possibly by taking medicines. Your health care provider will help you make a plan to bring your blood pressure within a normal range. You may be referred for counseling on a healthy diet and physical activity. Nutrition  Eat a diet that is high in fiber and potassium, and low in salt (sodium), added sugar, and fat. An example eating plan is called the DASH diet. DASH stands for Dietary Approaches to Stop Hypertension. To eat this way: Eat plenty of fresh fruits and vegetables. Try to fill one-half of your plate at each meal with fruits and vegetables. Eat whole grains, such as whole-wheat pasta, brown rice, or whole-grain bread. Fill about one-fourth of your plate with whole grains. Eat low-fat dairy products. Avoid fatty cuts of meat, processed or cured meats, and poultry with skin. Fill about one-fourth of your plate with lean proteins such as fish, chicken without skin, beans, eggs, and tofu. Avoid pre-made and processed foods. These tend to be higher in sodium, added sugar, and fat. Reduce your daily sodium intake. Many people with hypertension should eat less than 1,500 mg of sodium a day. Lifestyle  Work with your health care provider to maintain a healthy body weight or to lose weight. Ask what an ideal weight is for you. Get at least 30 minutes of exercise that causes your heart to beat faster (aerobic exercise) most days of the week. Activities may include walking, swimming, or biking. Include exercise to strengthen your muscles (resistance exercise), such as weight lifting, as part of your weekly exercise routine. Try to do these types of exercises for 30 minutes at least 3 days a week. Do   not use any products that contain nicotine or tobacco. These products include cigarettes, chewing tobacco, and vaping devices, such as e-cigarettes. If you need help quitting, ask your  health care provider. Control any long-term (chronic) conditions you have, such as high cholesterol or diabetes. Identify your sources of stress and find ways to manage stress. This may include meditation, deep breathing, or making time for fun activities. Alcohol use Do not drink alcohol if: Your health care provider tells you not to drink. You are pregnant, may be pregnant, or are planning to become pregnant. If you drink alcohol: Limit how much you have to: 0-1 drink a day for women. 0-2 drinks a day for men. Know how much alcohol is in your drink. In the [place] one drink equals one 12 oz bottle of beer (355 mL), one 5 oz glass of wine (148 mL), or one 1 oz glass of hard liquor (44 mL). Medicines Your health care provider may prescribe medicine if lifestyle changes are not enough to get your blood pressure under control and if: Your systolic blood pressure is 130 or higher. Your diastolic blood pressure is 80 or higher. Take medicines only as told by your health care provider. Follow the directions carefully. Blood pressure medicines must be taken as told by your health care provider. The medicine does not work as well when you skip doses. Skipping doses also puts you at risk for problems. Monitoring Before you monitor your blood pressure: Do not smoke, drink caffeinated beverages, or exercise within 30 minutes before taking a measurement. Use the bathroom and empty your bladder (urinate). Sit quietly for at least 5 minutes before taking measurements. Monitor your blood pressure at home as told by your health care provider. To do this: Sit with your back straight and supported. Place your feet flat on the floor. Do not cross your legs. Support your arm on a flat surface, such as a table. Make sure your upper arm is at heart level. Each time you measure, take two or three readings one minute apart and record the results. You may also need to have your blood pressure checked regularly by  your health care provider. General information Talk with your health care provider about your diet, exercise habits, and other lifestyle factors that may be contributing to hypertension. Review all the medicines you take with your health care provider because there may be side effects or interactions. Keep all follow-up visits. Your health care provider can help you create and adjust your plan for managing your high blood pressure. Where to find more information National Heart, Lung, and Blood Institute: www.nhlbi.nih.gov American Heart Association: www.heart.org Contact a health care provider if: You think you are having a reaction to medicines you have taken. You have repeated (recurrent) headaches. You feel dizzy. You have swelling in your ankles. You have trouble with your vision. Get help right away if: You develop a severe headache or confusion. You have unusual weakness or numbness, or you feel faint. You have severe pain in your chest or abdomen. You vomit repeatedly. You have trouble breathing. These symptoms may be an emergency. Get help right away. Call 911. Do not wait to see if the symptoms will go away. Do not drive yourself to the hospital. Summary Hypertension is when the force of blood pumping through your arteries is too strong. If this condition is not controlled, it may put you at risk for serious complications. Your personal target blood pressure may vary depending on your medical conditions,   your age, and other factors. For most people, a normal blood pressure is less than 120/80. Hypertension is managed by lifestyle changes, medicines, or both. Lifestyle changes to help manage hypertension include losing weight, eating a healthy, low-sodium diet, exercising more, stopping smoking, and limiting alcohol. This information is not intended to replace advice given to you by your health care provider. Make sure you discuss any questions you have with your health care  provider. Document Revised: 12/31/2020 Document Reviewed: 12/31/2020 Elsevier Patient Education  2023 [employer]  

## 2022-07-07 LAB — CMP14+EGFR
ALT: 28 IU/L (ref 0–44)
AST: 22 IU/L (ref 0–40)
Albumin/Globulin Ratio: 1.6 (ref 1.2–2.2)
Albumin: 4.2 g/dL (ref 3.8–4.9)
Alkaline Phosphatase: 85 IU/L (ref 44–121)
BUN/Creatinine Ratio: 14 (ref 9–20)
BUN: 16 mg/dL (ref 6–24)
Bilirubin Total: 0.5 mg/dL (ref 0.0–1.2)
CO2: 23 mmol/L (ref 20–29)
Calcium: 9.8 mg/dL (ref 8.7–10.2)
Chloride: 102 mmol/L (ref 96–106)
Creatinine, Ser: 1.18 mg/dL (ref 0.76–1.27)
Globulin, Total: 2.7 g/dL (ref 1.5–4.5)
Glucose: 175 mg/dL — ABNORMAL HIGH (ref 70–99)
Potassium: 4.2 mmol/L (ref 3.5–5.2)
Sodium: 139 mmol/L (ref 134–144)
Total Protein: 6.9 g/dL (ref 6.0–8.5)
eGFR: 72 mL/min/{1.73_m2} (ref >59)

## 2022-07-07 LAB — MICROALBUMIN / CREATININE URINE RATIO
Creatinine, Urine: 242.2 mg/dL
Microalb/Creat Ratio: 10 mg/g creat (ref 0–29)
Microalbumin, Urine: 24.4 ug/mL

## 2022-07-07 LAB — VITAMIN B12: Vitamin B-12: 617 pg/mL (ref 232–1245)

## 2022-07-11 ENCOUNTER — Encounter: Payer: Self-pay | Admitting: Podiatry

## 2022-07-11 ENCOUNTER — Ambulatory Visit (INDEPENDENT_AMBULATORY_CARE_PROVIDER_SITE_OTHER): Payer: Medicaid Other | Admitting: Podiatry

## 2022-07-11 DIAGNOSIS — M2042 Other hammer toe(s) (acquired), left foot: Secondary | ICD-10-CM

## 2022-07-11 DIAGNOSIS — E1140 Type 2 diabetes mellitus with diabetic neuropathy, unspecified: Secondary | ICD-10-CM | POA: Diagnosis not present

## 2022-07-11 DIAGNOSIS — L84 Corns and callosities: Secondary | ICD-10-CM | POA: Diagnosis not present

## 2022-07-11 DIAGNOSIS — E1149 Type 2 diabetes mellitus with other diabetic neurological complication: Secondary | ICD-10-CM | POA: Diagnosis not present

## 2022-07-11 NOTE — Progress Notes (Signed)
Subjective:   Patient ID: Kyle Newman, male   DOB: 57 y.o.   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

## 2022-08-08 ENCOUNTER — Ambulatory Visit: Payer: Medicaid Other | Admitting: Family Medicine

## 2022-08-31 ENCOUNTER — Ambulatory Visit: Payer: Medicaid Other | Admitting: Family Medicine

## 2022-11-08 ENCOUNTER — Encounter: Payer: Self-pay | Admitting: Family Medicine

## 2022-11-08 ENCOUNTER — Ambulatory Visit: Payer: Medicaid Other | Attending: Family Medicine | Admitting: Family Medicine

## 2022-11-08 VITALS — BP 163/96 | HR 75 | Temp 98.1°F | Wt 260.6 lb

## 2022-11-08 DIAGNOSIS — Z1159 Encounter for screening for other viral diseases: Secondary | ICD-10-CM | POA: Diagnosis not present

## 2022-11-08 DIAGNOSIS — E1159 Type 2 diabetes mellitus with other circulatory complications: Secondary | ICD-10-CM

## 2022-11-08 DIAGNOSIS — R399 Unspecified symptoms and signs involving the genitourinary system: Secondary | ICD-10-CM | POA: Diagnosis not present

## 2022-11-08 DIAGNOSIS — Z125 Encounter for screening for malignant neoplasm of prostate: Secondary | ICD-10-CM | POA: Diagnosis not present

## 2022-11-08 DIAGNOSIS — E1169 Type 2 diabetes mellitus with other specified complication: Secondary | ICD-10-CM | POA: Diagnosis not present

## 2022-11-08 DIAGNOSIS — I10 Essential (primary) hypertension: Secondary | ICD-10-CM | POA: Diagnosis not present

## 2022-11-08 DIAGNOSIS — Z1211 Encounter for screening for malignant neoplasm of colon: Secondary | ICD-10-CM

## 2022-11-08 DIAGNOSIS — I152 Hypertension secondary to endocrine disorders: Secondary | ICD-10-CM | POA: Diagnosis not present

## 2022-11-08 LAB — GLUCOSE, POCT (MANUAL RESULT ENTRY): POC Glucose: 100 mg/dl — AB (ref 70–99)

## 2022-11-08 LAB — POCT GLYCOSYLATED HEMOGLOBIN (HGB A1C): Hemoglobin A1C: 6.4 % — AB (ref 4.0–5.6)

## 2022-11-08 MED ORDER — TAMSULOSIN HCL 0.4 MG PO CAPS: 0.4000 mg | ORAL_CAPSULE | Freq: Every day | ORAL | 3 refills | Status: AC

## 2022-11-08 MED ORDER — AMLODIPINE BESYLATE 5 MG PO TABS: 5.0000 mg | ORAL_TABLET | Freq: Every day | ORAL | 1 refills | Status: AC

## 2022-11-08 MED ORDER — ATORVASTATIN CALCIUM 20 MG PO TABS: 20.0000 mg | ORAL_TABLET | Freq: Every day | ORAL | 1 refills | Status: AC

## 2022-11-08 NOTE — Patient Instructions (Signed)
Managing Your Hypertension Hypertension, also called high blood pressure, is when the force of the blood pressing against the walls of the arteries is too strong. Arteries are blood vessels that carry blood from your heart throughout your body. Hypertension forces the heart to work harder to pump blood and may cause the arteries to become narrow or stiff. Understanding blood pressure readings A blood pressure reading includes a higher number over a lower number: The first, or top, number is called the systolic pressure. It is a measure of the pressure in your arteries as your heart beats. The second, or bottom number, is called the diastolic pressure. It is a measure of the pressure in your arteries as the heart relaxes. For most people, a normal blood pressure is below 120/80. Your personal target blood pressure may vary depending on your medical conditions, your age, and other factors. Blood pressure is classified into four stages. Based on your blood pressure reading, your health care provider may use the following stages to determine what type of treatment you need, if any. Systolic pressure and diastolic pressure are measured in a unit called millimeters of mercury (mmHg). Normal Systolic pressure: below 120. Diastolic pressure: below 80. Elevated Systolic pressure: 120-129. Diastolic pressure: below 80. Hypertension stage 1 Systolic pressure: 130-139. Diastolic pressure: 80-89. Hypertension stage 2 Systolic pressure: 140 or above. Diastolic pressure: 90 or above. How can this condition affect me? Managing your hypertension is very important. Over time, hypertension can damage the arteries and decrease blood flow to parts of the body, including the brain, heart, and kidneys. Having untreated or uncontrolled hypertension can lead to: A heart attack. A stroke. A weakened blood vessel (aneurysm). Heart failure. Kidney damage. Eye damage. Memory and concentration problems. Vascular  dementia. What actions can I take to manage this condition? Hypertension can be managed by making lifestyle changes and possibly by taking medicines. Your health care provider will help you make a plan to bring your blood pressure within a normal range. You may be referred for counseling on a healthy diet and physical activity. Nutrition  Eat a diet that is high in fiber and potassium, and low in salt (sodium), added sugar, and fat. An example eating plan is called the DASH diet. DASH stands for Dietary Approaches to Stop Hypertension. To eat this way: Eat plenty of fresh fruits and vegetables. Try to fill one-half of your plate at each meal with fruits and vegetables. Eat whole grains, such as whole-wheat pasta, brown rice, or whole-grain bread. Fill about one-fourth of your plate with whole grains. Eat low-fat dairy products. Avoid fatty cuts of meat, processed or cured meats, and poultry with skin. Fill about one-fourth of your plate with lean proteins such as fish, chicken without skin, beans, eggs, and tofu. Avoid pre-made and processed foods. These tend to be higher in sodium, added sugar, and fat. Reduce your daily sodium intake. Many people with hypertension should eat less than 1,500 mg of sodium a day. Lifestyle  Work with your health care provider to maintain a healthy body weight or to lose weight. Ask what an ideal weight is for you. Get at least 30 minutes of exercise that causes your heart to beat faster (aerobic exercise) most days of the week. Activities may include walking, swimming, or biking. Include exercise to strengthen your muscles (resistance exercise), such as weight lifting, as part of your weekly exercise routine. Try to do these types of exercises for 30 minutes at least 3 days a week. Do   not use any products that contain nicotine or tobacco. These products include cigarettes, chewing tobacco, and vaping devices, such as e-cigarettes. If you need help quitting, ask your  health care provider. Control any long-term (chronic) conditions you have, such as high cholesterol or diabetes. Identify your sources of stress and find ways to manage stress. This may include meditation, deep breathing, or making time for fun activities. Alcohol use Do not drink alcohol if: Your health care provider tells you not to drink. You are pregnant, may be pregnant, or are planning to become pregnant. If you drink alcohol: Limit how much you have to: 0-1 drink a day for women. 0-2 drinks a day for men. Know how much alcohol is in your drink. In the [place] one drink equals one 12 oz bottle of beer (355 mL), one 5 oz glass of wine (148 mL), or one 1 oz glass of hard liquor (44 mL). Medicines Your health care provider may prescribe medicine if lifestyle changes are not enough to get your blood pressure under control and if: Your systolic blood pressure is 130 or higher. Your diastolic blood pressure is 80 or higher. Take medicines only as told by your health care provider. Follow the directions carefully. Blood pressure medicines must be taken as told by your health care provider. The medicine does not work as well when you skip doses. Skipping doses also puts you at risk for problems. Monitoring Before you monitor your blood pressure: Do not smoke, drink caffeinated beverages, or exercise within 30 minutes before taking a measurement. Use the bathroom and empty your bladder (urinate). Sit quietly for at least 5 minutes before taking measurements. Monitor your blood pressure at home as told by your health care provider. To do this: Sit with your back straight and supported. Place your feet flat on the floor. Do not cross your legs. Support your arm on a flat surface, such as a table. Make sure your upper arm is at heart level. Each time you measure, take two or three readings one minute apart and record the results. You may also need to have your blood pressure checked regularly by  your health care provider. General information Talk with your health care provider about your diet, exercise habits, and other lifestyle factors that may be contributing to hypertension. Review all the medicines you take with your health care provider because there may be side effects or interactions. Keep all follow-up visits. Your health care provider can help you create and adjust your plan for managing your high blood pressure. Where to find more information National Heart, Lung, and Blood Institute: www.nhlbi.nih.gov American Heart Association: www.heart.org Contact a health care provider if: You think you are having a reaction to medicines you have taken. You have repeated (recurrent) headaches. You feel dizzy. You have swelling in your ankles. You have trouble with your vision. Get help right away if: You develop a severe headache or confusion. You have unusual weakness or numbness, or you feel faint. You have severe pain in your chest or abdomen. You vomit repeatedly. You have trouble breathing. These symptoms may be an emergency. Get help right away. Call 911. Do not wait to see if the symptoms will go away. Do not drive yourself to the hospital. Summary Hypertension is when the force of blood pumping through your arteries is too strong. If this condition is not controlled, it may put you at risk for serious complications. Your personal target blood pressure may vary depending on your medical conditions,   your age, and other factors. For most people, a normal blood pressure is less than 120/80. Hypertension is managed by lifestyle changes, medicines, or both. Lifestyle changes to help manage hypertension include losing weight, eating a healthy, low-sodium diet, exercising more, stopping smoking, and limiting alcohol. This information is not intended to replace advice given to you by your health care provider. Make sure you discuss any questions you have with your health care  provider. Document Revised: 12/31/2020 Document Reviewed: 12/31/2020 Elsevier Patient Education  2024 Elsevier Inc.  
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
## 2022-11-16 ENCOUNTER — Ambulatory Visit: Payer: Self-pay | Admitting: *Deleted

## 2022-11-16 ENCOUNTER — Other Ambulatory Visit (HOSPITAL_COMMUNITY)
Admission: RE | Admit: 2022-11-16 | Discharge: 2022-11-16 | Disposition: A | Discharge status: Home / Self Care | Payer: Medicaid Other | Source: Ambulatory Visit | Attending: Family Medicine | Admitting: Family Medicine

## 2022-11-16 ENCOUNTER — Other Ambulatory Visit: Payer: Self-pay

## 2022-11-16 DIAGNOSIS — Z1159 Encounter for screening for other viral diseases: Secondary | ICD-10-CM

## 2022-11-16 NOTE — Telephone Encounter (Signed)
Spoke with patient . Verified name & DOB    Patient schedule for STD test on 11/17/2022

## 2022-11-16 NOTE — Telephone Encounter (Signed)
  Chief Complaint: exposed to STI via partner just dx with trichomonas Symptoms: tingling with urination Frequency: past week partner dx  Pertinent Negatives: Patient denies discharge from penis  Disposition: [] ED /[] Urgent Care (no appt availability in office) / [] Appointment(In office/virtual)/ []  [facility]/ [] Home Care/ [] Refused Recommended Disposition /[]  Mobile Bus/ [x]  Follow-up with PCP Additional Notes:   Requesting medication  and if appt needed. Pt partner is patient of Dr. Alvis Lemmings as well, Dianne Dun and pt requesting if he can get medication as well. Offered mobile bus. Declined and reports he hopes PCP will write Rx due to treating his partner. Patient would like a call back.    Reason for Disposition  Sex partner of someone who was diagnosed with an STI  (Exception: Male exposed to bacterial vaginosis or vaginal yeast infection.)  Answer Assessment - Initial Assessment Questions 1. MAIN CONCERN: "What were you exposed to?"  "What sexually transmitted infection (STI) does your sex partner have?" (e.g., gonorrhea, herpes, HIV, pubic lice)     Trichomonas  2. ROUTE of EXPOSURE: "How were you exposed to the STI?" (e.g., oral, vaginal, or rectal intercourse)     Na  3. DATE of EXPOSURE: "When did the exposure occur?" (e.g., days)     Has been with partner  4. SYMPTOMS: "Do you have any symptoms?" (e.g., pain with urination, rash, sores)     Tingling with urination 5. PREGNANCY: "Is there any chance you are pregnant?" "When was your last menstrual period?"     Na  Protocols used: STI Exposure-A-AH

## 2022-11-17 ENCOUNTER — Ambulatory Visit: Payer: Medicaid Other

## 2022-11-17 ENCOUNTER — Telehealth: Payer: Self-pay

## 2022-11-17 ENCOUNTER — Ambulatory Visit: Payer: Medicaid Other | Attending: Family Medicine

## 2022-11-17 DIAGNOSIS — R350 Frequency of micturition: Secondary | ICD-10-CM

## 2022-11-17 NOTE — Telephone Encounter (Signed)
Copied from CRM [id number]. Topic: General - Other >> Nov 17, 2022  3:00 PM Ja-Kwan M wrote: Reason for CRM: Gennine with [facility] reports that they were unable to do Gardnerella and Candida from urine sample. Cb# [phone]

## 2022-11-17 NOTE — Progress Notes (Signed)
Urine cytology collected and label appropriately.

## 2022-11-17 NOTE — Addendum Note (Signed)
Addended by: Elsie Lincoln F on: 11/17/2022 05:07 PM   Modules accepted: Level of Service

## 2022-11-17 NOTE — Telephone Encounter (Signed)
Noted  

## 2022-11-18 LAB — URINE CYTOLOGY ANCILLARY ONLY
Chlamydia: NEGATIVE
Comment: NEGATIVE
Comment: NEGATIVE
Comment: NORMAL
Neisseria Gonorrhea: NEGATIVE
Trichomonas: POSITIVE — AB

## 2022-11-21 ENCOUNTER — Telehealth: Payer: Self-pay | Admitting: Family Medicine

## 2022-11-21 ENCOUNTER — Other Ambulatory Visit: Payer: Self-pay | Admitting: Family Medicine

## 2022-11-21 MED ORDER — METRONIDAZOLE 500 MG PO TABS: 2000.0000 mg | ORAL_TABLET | Freq: Once | ORAL | 0 refills | Status: AC

## 2022-11-21 NOTE — Telephone Encounter (Signed)
Pt did STD screening and was positive for Trichomoniasis and he is requesting treatment

## 2022-11-21 NOTE — Telephone Encounter (Signed)
Prescription has been sent to his pharmacy.

## 2022-11-21 NOTE — Telephone Encounter (Signed)
Copied from CRM [id number]. Topic: General - Inquiry >> Nov 21, 2022  3:22 PM De Blanch wrote: Reason for [id number] Alona Bene stated that pt is her husband, and they are waiting for Dr. Alvis Lemmings to call in his medication for recent lab results.  Please advise.

## 2022-11-22 ENCOUNTER — Encounter: Payer: Self-pay | Admitting: *Deleted

## 2022-11-22 NOTE — Telephone Encounter (Signed)
Patient called to request rationale for dosing of medication metronidazole. Wants to understand why he was prescribed same medication as his partner for treatment of trichomonas to take metronidazole 500 mg to take 4 tablets by mouth once and partner needs to take her medication , he reports as same medication, over a 7 day period. Patient concerned his infection may not be treated and may re infect his partner. Patient would like to know how long does he have to wait to be retested to make sure infection is gone? Please advise.

## 2022-11-22 NOTE — Telephone Encounter (Signed)
Pt was called and informed of medication being sent to pharmacy, pt states he wants to know why he is only taking the pills for one day and his wife is taking them for multiple days.

## 2022-11-22 NOTE — Telephone Encounter (Signed)
This encounter was created in error - please disregard.

## 2022-11-28 ENCOUNTER — Emergency Department (HOSPITAL_COMMUNITY)
Admission: EM | Admit: 2022-11-28 | Discharge: 2022-12-01 | Disposition: E | Payer: Medicaid Other | Attending: Emergency Medicine | Admitting: Emergency Medicine

## 2022-11-28 DIAGNOSIS — R1110 Vomiting, unspecified: Secondary | ICD-10-CM | POA: Insufficient documentation

## 2022-11-28 DIAGNOSIS — W3400XA Accidental discharge from unspecified firearms or gun, initial encounter: Secondary | ICD-10-CM | POA: Insufficient documentation

## 2022-11-28 DIAGNOSIS — S31109A Unspecified open wound of abdominal wall, unspecified quadrant without penetration into peritoneal cavity, initial encounter: Secondary | ICD-10-CM | POA: Insufficient documentation

## 2022-11-28 MED ORDER — EPINEPHRINE 1 MG/10ML IJ SOSY
PREFILLED_SYRINGE | INTRAMUSCULAR | Status: AC | PRN
  Administered 2022-11-28: 1 mg via INTRAVENOUS
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
## 2022-12-01 NOTE — ED Notes (Signed)
2 shoes and 2 socks given to [employer] in 1 brown bag
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
## 2022-12-01 DEATH — deceased

## 2022-12-08 IMAGING — CT CT ABD-PELV W/ CM
2 of 5 series · 17 of 46 positions shown, 19 images · IV contrast (Omni 300)
Comparison: None Available.

CLINICAL DATA: Acute abdominal pain which is nonlocalized

EXAM:
CT ABDOMEN AND PELVIS WITH CONTRAST
TECHNIQUE: Multidetector CT imaging of the abdomen and pelvis was performed
using the standard protocol following bolus administration of
intravenous contrast.

[Series 3: a/p w/ 5mm · axial · 0.89mm/px · z∈[+736,+1161]mm · 14 of 97 slices shown, 16 images]
[im 6/97  soft-tissue]
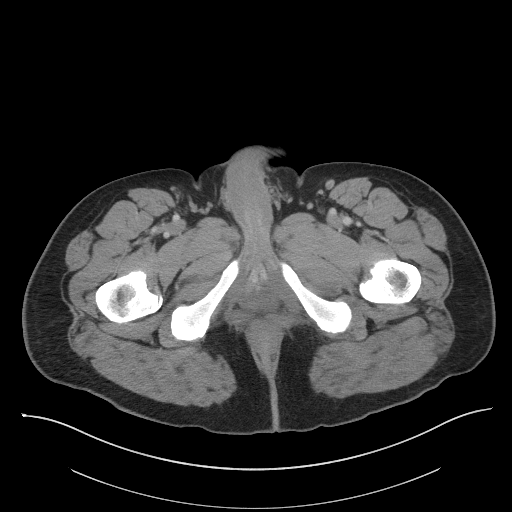
[im 6/97  bone]
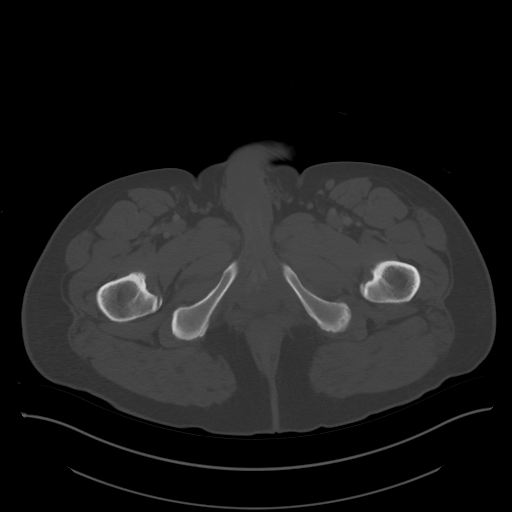
[im 11/97  soft-tissue]
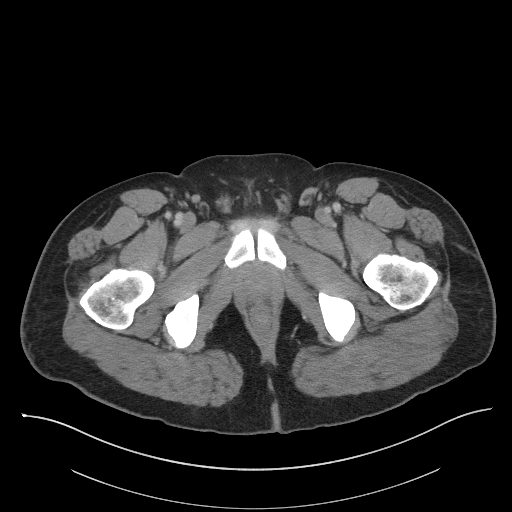
[im 21/97  soft-tissue]
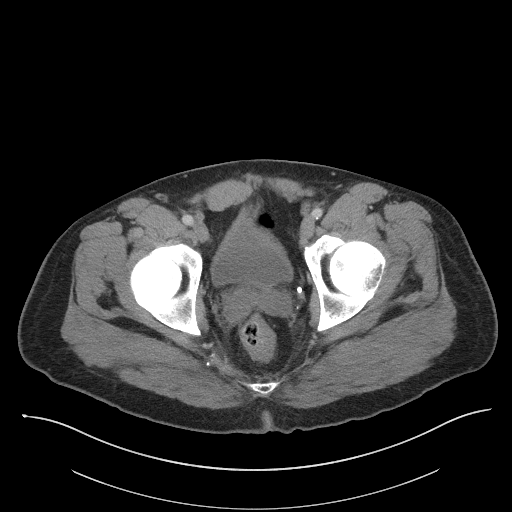
[im 26/97  soft-tissue]
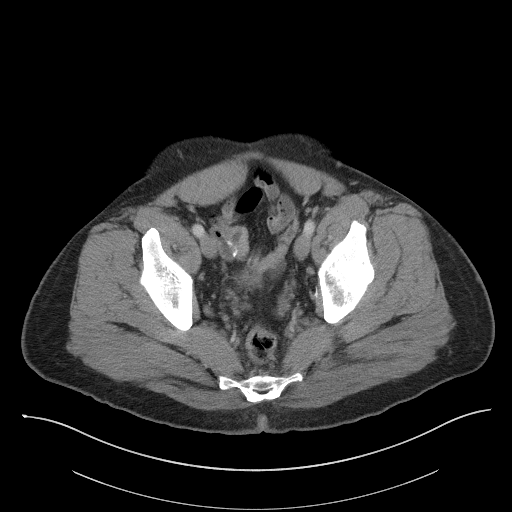
[im 31/97  soft-tissue]
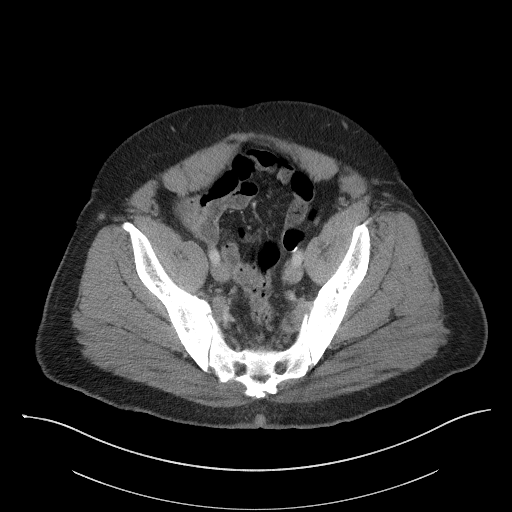
[im 41/97  soft-tissue]
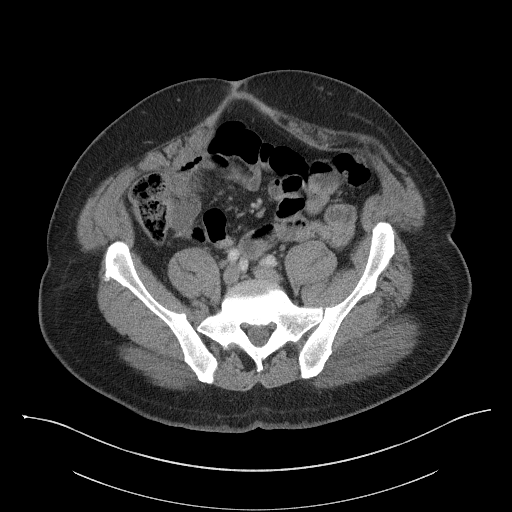
[im 46/97  soft-tissue]
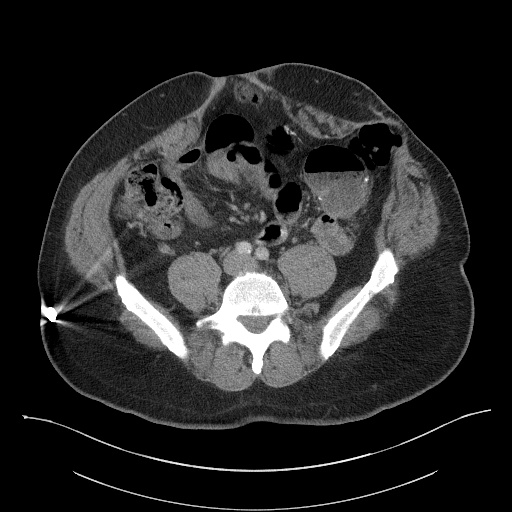
[im 51/97  soft-tissue]
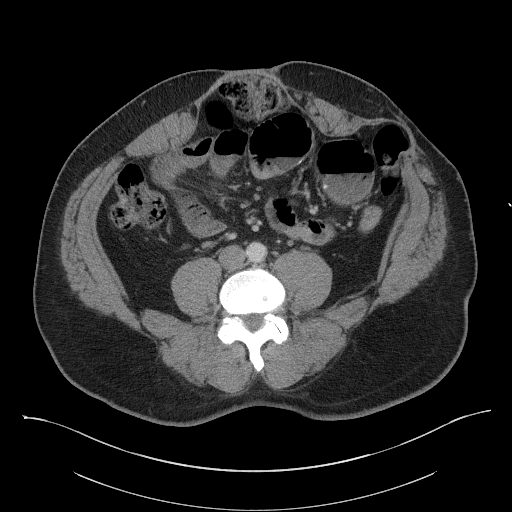
[im 56/97  soft-tissue]
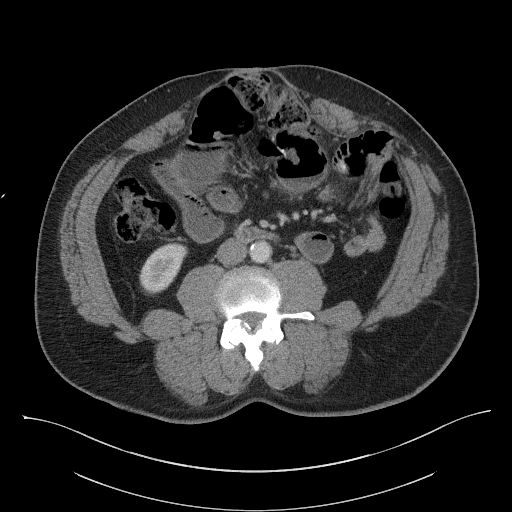
[im 56/97  bone]
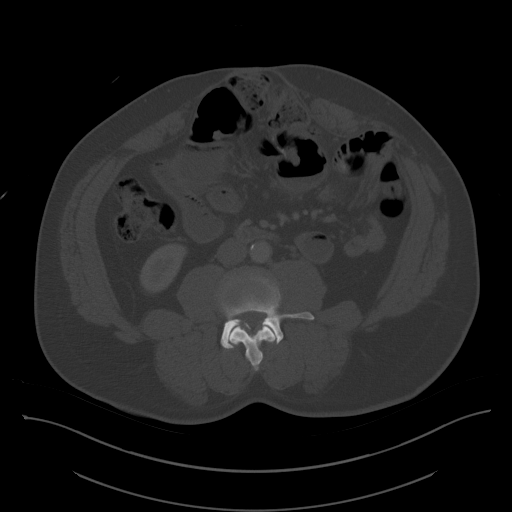
[im 66/97  soft-tissue]
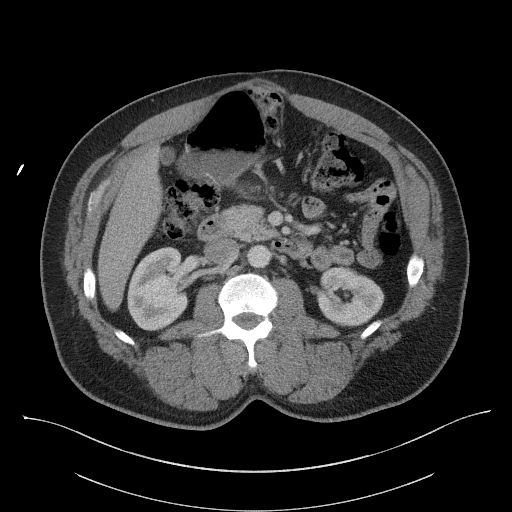
[im 71/97  soft-tissue]
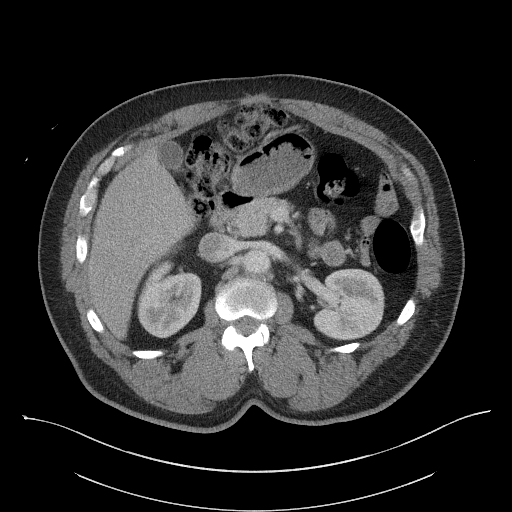
[im 76/97  soft-tissue]
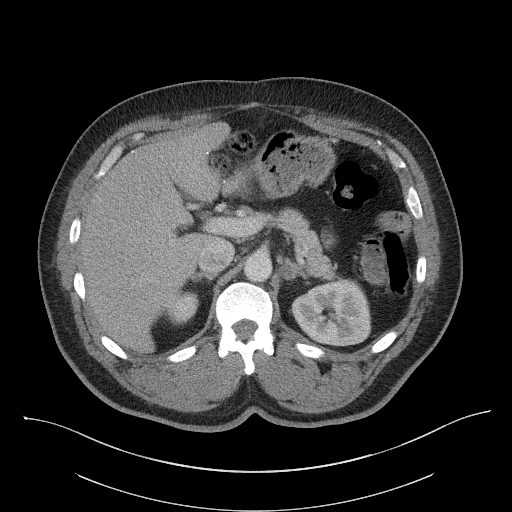
[im 86/97  soft-tissue]
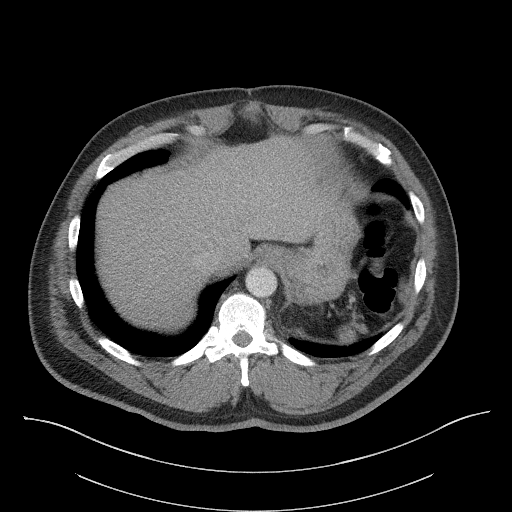
[im 91/97  soft-tissue]
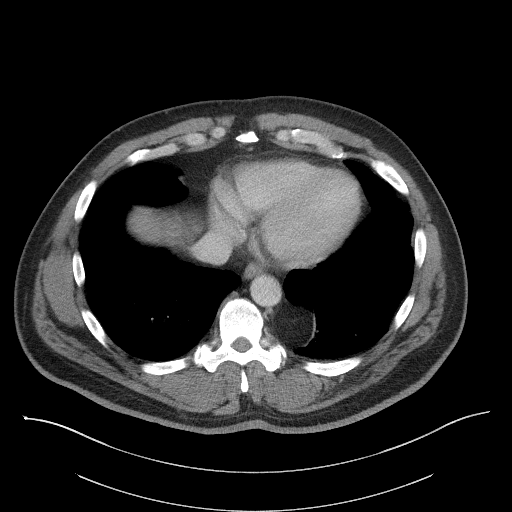

[Series 7: a/p w/ cor · coronal · 0.88mm/px · 3 of 170 slices shown]
[im 57/170  soft-tissue]
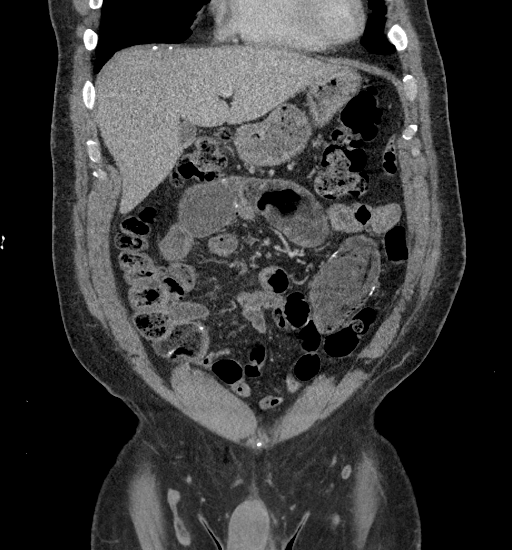
[im 76/170  soft-tissue]
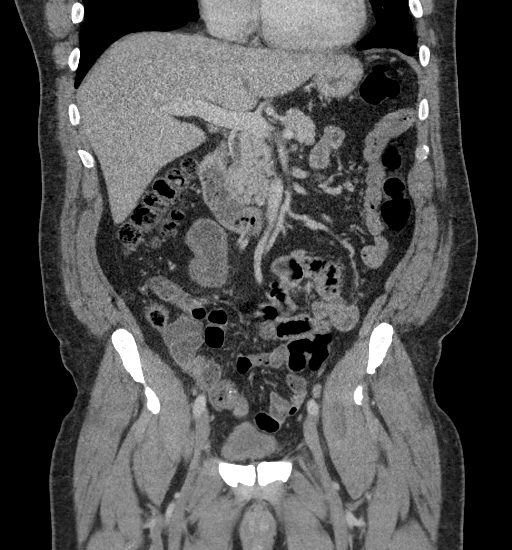
[im 94/170  soft-tissue]
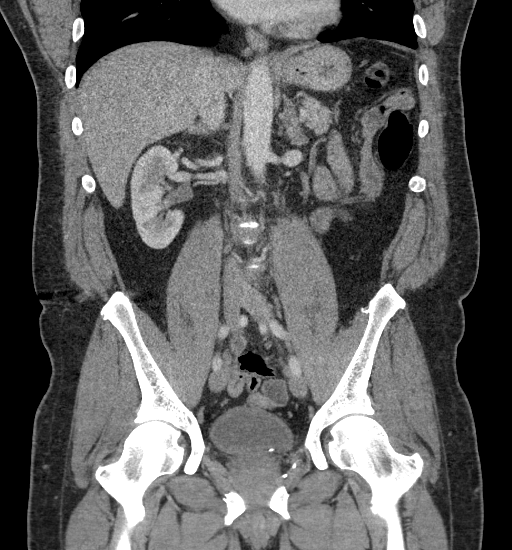

[17 of 46 positions shown; findings below may reference images not displayed]

RADIATION DOSE REDUCTION: This exam was performed according to the
departmental dose-optimization program which includes automated
exposure control, adjustment of the mA and/or kV according to
patient size and/or use of iterative reconstruction technique.

CONTRAST:  100mL OMNIPAQUE IOHEXOL 300 MG/ML  SOLN
FINDINGS: Lower chest: No contributory findings. Fatty Bochdalek's hernia on
the left.

Hepatobiliary: No focal liver abnormality.No evidence of biliary
obstruction or stone.

Pancreas: Unremarkable.

Spleen: Absent.

Adrenals/Urinary Tract: 13 mm left adrenal nodule. No hydronephrosis
or stone. Unremarkable bladder.

Stomach/Bowel: Dilated segments of small bowel which are isolated to
anastomotic aneurysmal segments which are multiple. Evidence of
prior descending colostomy. No generalized obstructive pattern or
bowel inflammation.

Vascular/Lymphatic: Mild atheromatous calcification. No mass or
adenopathy.

Reproductive:No pathologic findings.

Other: No ascites or pneumoperitoneum.

Musculoskeletal: No acute abnormalities. Ordinary degenerative
changes
IMPRESSION: 1. No acute finding.
2. 13 mm left adrenal nodule, recommend correlation with outside
imaging to determine stability.
3. Fatty Bochdalek's hernia on the left.

## 2023-02-08 ENCOUNTER — Ambulatory Visit: Payer: Medicaid Other | Admitting: Family Medicine

## 2023-12-01 NOTE — Procedures (Signed)
Mask fit
